# Patient Record
Sex: Female | Born: 1995 | Race: Black or African American | Hispanic: No | Marital: Single | Smoking: Former smoker
Health system: Southern US, Community
[De-identification: ages and names within clinical notes are randomized; demographics above are authoritative.]

## PROBLEM LIST (undated history)

## (undated) HISTORY — PX: NO PAST SURGERIES: SHX2092

---

## 2018-07-05 ENCOUNTER — Telehealth: Payer: Self-pay | Admitting: *Deleted

## 2018-07-05 ENCOUNTER — Telehealth (INDEPENDENT_AMBULATORY_CARE_PROVIDER_SITE_OTHER): Payer: Self-pay | Admitting: Emergency Medicine

## 2018-07-05 ENCOUNTER — Encounter: Payer: Self-pay | Admitting: Emergency Medicine

## 2018-07-05 ENCOUNTER — Other Ambulatory Visit: Payer: Self-pay

## 2018-07-05 DIAGNOSIS — R112 Nausea with vomiting, unspecified: Secondary | ICD-10-CM

## 2018-07-05 NOTE — Telephone Encounter (Signed)
Called spoke to patient about her illness and if she needs an office visit or telemed to get a out of work note. Patient states she was slightly nauseated and vomited at work last night. Patient has sharpe pain in the stomach and she had this problem before and it comes and goes. While the patient was on the phone with me she was having the pain. Patient has no fever. Patient last menstrual period was 06/25/2018. Patient states she does not have any GI problems. I advised patient she will schedule as a telemed visit for today.

## 2018-07-05 NOTE — Telephone Encounter (Signed)
error 

## 2018-07-05 NOTE — Progress Notes (Signed)
Telemedicine Encounter- SOAP NOTE Established Patient  This telephone encounter was conducted with the patient's (or proxy's) verbal consent via audio telecommunications: yes/no: Yes Patient was instructed to have this encounter in a suitably private space; and to only have persons present to whom they give permission to participate. In addition, patient identity was confirmed by use of name plus two identifiers (DOB and address).  I discussed the limitations, risks, security and privacy concerns of performing an evaluation and management service by telephone and the availability of in person appointments. I also discussed with the patient that there may be a patient responsible charge related to this service. The patient expressed understanding and agreed to proceed.  I spent a total of TIME; 0 MIN TO 60 MIN: 15 minutes talking with the patient or their proxy.  No chief complaint on file. Nausea and vomiting  Subjective   Erika Shaw is a 23 y.o. female established patient. Telephone visit today for evaluation of nausea and vomiting.  Also needs a work note to return tonight.  Felt nauseous last night and threw up once.  Also had mild diffuse abdominal cramping without diarrhea.  Denies fever or chills.  No longer vomiting.  Works night shift 11 PM to 7 AM.  No other associated symptoms.  Feels fine now and would like to return to work tonight.  HPI   There are no active problems to display for this patient.   History reviewed. No pertinent past medical history.  No current outpatient medications on file.   No current facility-administered medications for this visit.     Not on File  Social History   Socioeconomic History  . Marital status: Not on file    Spouse name: Not on file  . Number of children: Not on file  . Years of education: Not on file  . Highest education level: Not on file  Occupational History  . Not on file  Social Needs  . Financial resource strain:  Not on file  . Food insecurity:    Worry: Not on file    Inability: Not on file  . Transportation needs:    Medical: Not on file    Non-medical: Not on file  Tobacco Use  . Smoking status: Not on file  Substance and Sexual Activity  . Alcohol use: Not on file  . Drug use: Not on file  . Sexual activity: Not on file  Lifestyle  . Physical activity:    Days per week: Not on file    Minutes per session: Not on file  . Stress: Not on file  Relationships  . Social connections:    Talks on phone: Not on file    Gets together: Not on file    Attends religious service: Not on file    Active member of club or organization: Not on file    Attends meetings of clubs or organizations: Not on file    Relationship status: Not on file  . Intimate partner violence:    Fear of current or ex partner: Not on file    Emotionally abused: Not on file    Physically abused: Not on file    Forced sexual activity: Not on file  Other Topics Concern  . Not on file  Social History Narrative  . Not on file    Review of Systems  Constitutional: Negative for chills and fever.  HENT: Negative for congestion and sore throat.   Respiratory: Negative for cough and shortness  of breath.   Cardiovascular: Negative for chest pain and palpitations.  Gastrointestinal: Positive for abdominal pain, nausea and vomiting. Negative for blood in stool, constipation, diarrhea, heartburn and melena.  Genitourinary: Negative for dysuria, frequency, hematuria and urgency.  Musculoskeletal: Negative for myalgias.  Skin: Negative for rash.  Neurological: Negative for dizziness and headaches.  All other systems reviewed and are negative.   Objective   Vitals as reported by the patient: None available There were no vitals filed for this visit. Awake and oriented x3 in no apparent respiratory distress There are no diagnoses linked to this encounter. Clinically stable.  No medical concerns identified during this visit.  Able to return to work tonight.  Work note provided. Diagnoses and all orders for this visit:  Non-intractable vomiting with nausea, unspecified vomiting type Comments: Resolved     I discussed the assessment and treatment plan with the patient. The patient was provided an opportunity to ask questions and all were answered. The patient agreed with the plan and demonstrated an understanding of the instructions.   The patient was advised to call back or seek an in-person evaluation if the symptoms worsen or if the condition fails to improve as anticipated.  I provided 15 minutes of non-face-to-face time during this encounter.  Georgina QuintMiguel Jose Kemberly Taves, MD  [facility] at [facility]

## 2018-07-17 ENCOUNTER — Other Ambulatory Visit: Payer: Self-pay

## 2018-07-17 ENCOUNTER — Telehealth (INDEPENDENT_AMBULATORY_CARE_PROVIDER_SITE_OTHER): Payer: Self-pay | Admitting: Emergency Medicine

## 2018-07-17 ENCOUNTER — Encounter: Payer: Self-pay | Admitting: Emergency Medicine

## 2018-07-17 DIAGNOSIS — Z7689 Persons encountering health services in other specified circumstances: Secondary | ICD-10-CM

## 2018-07-17 NOTE — Progress Notes (Signed)
Called patient to triage for appointment. Patient state she needs a note to return to work after having a high temperature Friday night when tested.  Patient was sent home until she get the work note. Patient states she was not told the temperature reading.

## 2018-07-17 NOTE — Progress Notes (Signed)
Telemedicine Encounter- SOAP NOTE Established Patient  This telephone encounter was conducted with the patient's (or proxy's) verbal consent via audio telecommunications: yes/no: Yes Patient was instructed to have this encounter in a suitably private space; and to only have persons present to whom they give permission to participate. In addition, patient identity was confirmed by use of name plus two identifiers (DOB and address).  I discussed the limitations, risks, security and privacy concerns of performing an evaluation and management service by telephone and the availability of in person appointments. I also discussed with the patient that there may be a patient responsible charge related to this service. The patient expressed understanding and agreed to proceed.  I spent a total of TIME; 0 MIN TO 60 MIN: 10 minutes talking with the patient or their proxy.  No chief complaint on file. Needs a work note  Subjective   Erika Shaw is a 23 y.o. female established patient. Telephone visit today for work clearance.  Was sent home last Friday because of high temperature.  Needs work note to return tonight.  Asymptomatic.  Denies flulike symptoms.  Does not feel sick at all.  HPI   There are no active problems to display for this patient.   No past medical history on file.  No current outpatient medications on file.   No current facility-administered medications for this visit.     Not on File  Social History   Socioeconomic History  . Marital status: Not on file    Spouse name: Not on file  . Number of children: Not on file  . Years of education: Not on file  . Highest education level: Not on file  Occupational History  . Not on file  Social Needs  . Financial resource strain: Not on file  . Food insecurity:    Worry: Not on file    Inability: Not on file  . Transportation needs:    Medical: Not on file    Non-medical: Not on file  Tobacco Use  . Smoking status:  Not on file  Substance and Sexual Activity  . Alcohol use: Not on file  . Drug use: Not on file  . Sexual activity: Not on file  Lifestyle  . Physical activity:    Days per week: Not on file    Minutes per session: Not on file  . Stress: Not on file  Relationships  . Social connections:    Talks on phone: Not on file    Gets together: Not on file    Attends religious service: Not on file    Active member of club or organization: Not on file    Attends meetings of clubs or organizations: Not on file    Relationship status: Not on file  . Intimate partner violence:    Fear of current or ex partner: Not on file    Emotionally abused: Not on file    Physically abused: Not on file    Forced sexual activity: Not on file  Other Topics Concern  . Not on file  Social History Narrative  . Not on file    Review of Systems  Constitutional: Negative.  Negative for chills and fever.  HENT: Negative.  Negative for congestion and sore throat.   Eyes: Negative for redness.  Respiratory: Negative.  Negative for cough and shortness of breath.   Cardiovascular: Negative.  Negative for chest pain and palpitations.  Gastrointestinal: Negative.  Negative for abdominal pain, diarrhea, nausea and  vomiting.  Genitourinary: Negative.  Negative for dysuria and urgency.  Musculoskeletal: Negative for myalgias.  Skin: Negative.  Negative for rash.  Neurological: Negative for dizziness and headaches.    Objective   Vitals as reported by the patient: None available Awake and oriented x3 in no apparent respiratory distress.  No coughing. There were no vitals filed for this visit.  There are no diagnoses linked to this encounter.  Diagnoses and all orders for this visit:  Return to work evaluation    Clinically stable with no signs of illness.  Cleared to go back to work tonight. Work note prepared and printed for the patient to pick up.  I discussed the assessment and treatment plan with the  patient. The patient was provided an opportunity to ask questions and all were answered. The patient agreed with the plan and demonstrated an understanding of the instructions.   The patient was advised to call back or seek an in-person evaluation if the symptoms worsen or if the condition fails to improve as anticipated.  I provided 10 minutes of non-face-to-face time during this encounter.  Georgina QuintMiguel Jose Chrishawn Kring, MD  [facility]

## 2020-03-17 ENCOUNTER — Emergency Department (HOSPITAL_COMMUNITY): Payer: Self-pay

## 2020-03-17 ENCOUNTER — Emergency Department (HOSPITAL_COMMUNITY)
Admission: EM | Admit: 2020-03-17 | Discharge: 2020-03-18 | Disposition: A | Discharge status: Home / Self Care | Payer: Self-pay | Attending: Emergency Medicine | Admitting: Emergency Medicine

## 2020-03-17 ENCOUNTER — Encounter (HOSPITAL_COMMUNITY): Payer: Self-pay | Admitting: Emergency Medicine

## 2020-03-17 ENCOUNTER — Other Ambulatory Visit: Payer: Self-pay

## 2020-03-17 DIAGNOSIS — U071 COVID-19: Secondary | ICD-10-CM | POA: Insufficient documentation

## 2020-03-17 LAB — CBC
HCT: 43.1 % (ref 36.0–46.0)
Hemoglobin: 14.1 g/dL (ref 12.0–15.0)
MCH: 30.6 pg (ref 26.0–34.0)
MCHC: 32.7 g/dL (ref 30.0–36.0)
MCV: 93.5 fL (ref 80.0–100.0)
Platelets: 207 10*3/uL (ref 150–400)
RBC: 4.61 MIL/uL (ref 3.87–5.11)
RDW: 11.9 % (ref 11.5–15.5)
WBC: 4 10*3/uL (ref 4.0–10.5)
nRBC: 0 % (ref 0.0–0.2)

## 2020-03-17 LAB — BASIC METABOLIC PANEL
Anion gap: 8 (ref 5–15)
BUN: 12 mg/dL (ref 6–20)
CO2: 23 mmol/L (ref 22–32)
Calcium: 8.8 mg/dL — ABNORMAL LOW (ref 8.9–10.3)
Chloride: 107 mmol/L (ref 98–111)
Creatinine, Ser: 0.75 mg/dL (ref 0.44–1.00)
GFR, Estimated: >60 mL/min (ref >60)
Glucose, Bld: 83 mg/dL (ref 70–99)
Potassium: 3.6 mmol/L (ref 3.5–5.1)
Sodium: 138 mmol/L (ref 135–145)

## 2020-03-17 LAB — RESP PANEL BY RT-PCR (FLU A&B, COVID) ARPGX2
Influenza A by PCR: NEGATIVE
Influenza B by PCR: NEGATIVE
SARS Coronavirus 2 by RT PCR: POSITIVE — AB

## 2020-03-17 LAB — I-STAT BETA HCG BLOOD, ED (MC, WL, AP ONLY): I-stat hCG, quantitative: <5 m[IU]/mL (ref <5)

## 2020-03-17 LAB — TROPONIN I (HIGH SENSITIVITY): Troponin I (High Sensitivity): 3 ng/L (ref <18)

## 2020-03-17 MED ORDER — ACETAMINOPHEN 325 MG PO TABS
650.0000 mg | ORAL_TABLET | Freq: Once | ORAL | Status: AC | PRN
  Administered 2020-03-17: 650 mg via ORAL
  Filled 2020-03-17: qty 2

## 2020-03-17 NOTE — ED Triage Notes (Signed)
Pt c/o fever, headache, chest pain and sore throat x 2 days.

## 2020-03-18 LAB — TROPONIN I (HIGH SENSITIVITY): Troponin I (High Sensitivity): <2 ng/L (ref <18)

## 2020-03-18 MED ORDER — BENZONATATE 100 MG PO CAPS: 100.0000 mg | ORAL_CAPSULE | Freq: Three times a day (TID) | ORAL | 0 refills | Status: DC

## 2020-03-18 MED ORDER — NAPROXEN 500 MG PO TABS: 500.0000 mg | ORAL_TABLET | Freq: Two times a day (BID) | ORAL | 0 refills | Status: DC | PRN

## 2020-03-18 NOTE — ED Provider Notes (Signed)
[facility], [facility] EMERGENCY DEPARTMENT Provider Note   CSN: [id number] Arrival date & time: 03/17/20  2046     History Chief Complaint  Patient presents with  . Fever    Erika Shaw is a 25 y.o. female without significant past medical hx who presents to the ED with complaints of fever x 5 days. Patient reports fevers, chills, headaches, congestion, sore throat, cough, & chest pain w/ coughing. No other alleviating/aggravating factors. Feels mildly dyspneic at times. Denies syncope, leg pain/swelling, hemoptysis, recent surgery/trauma, recent long travel, personal hx of cancer, or hx of DVT/PE.    HPI     History reviewed. No pertinent past medical history.  There are no problems to display for this patient.   History reviewed. No pertinent surgical history.   OB History   No obstetric history on file.     No family history on file.     Home Medications Prior to Admission medications   Not on File    Allergies    Patient has no known allergies.  Review of Systems   Review of Systems  Constitutional: Positive for chills, fatigue and fever.  HENT: Positive for congestion.   Respiratory: Positive for cough and shortness of breath.   Cardiovascular: Positive for chest pain. Negative for leg swelling.  Gastrointestinal: Negative for abdominal pain and vomiting.  Neurological: Negative for syncope.  All other systems reviewed and are negative.   Physical Exam Updated Vital Signs BP 110/73 (BP Location: Left Arm)   Pulse 76   Temp 99 F (37.2 C) (Oral)   Resp 16   Ht 5\' 4"  (1.626 m)   Wt 79.4 kg   SpO2 96%   BMI 30.04 kg/m   Physical Exam Vitals and nursing note reviewed.  Constitutional:      General: She is not in acute distress.    Appearance: She is well-developed.  HENT:     Head: Normocephalic and atraumatic.     Right Ear: Ear canal normal. Tympanic membrane is not perforated, erythematous, retracted or bulging.     Left Ear:  Ear canal normal. Tympanic membrane is not perforated, erythematous, retracted or bulging.     Ears:     Comments: No mastoid erythema/swelling/tenderness.     Nose:     Right Sinus: No maxillary sinus tenderness or frontal sinus tenderness.     Left Sinus: No maxillary sinus tenderness or frontal sinus tenderness.     Mouth/Throat:     Pharynx: Uvula midline. No oropharyngeal exudate or posterior oropharyngeal erythema.     Comments: What appear to be cold sores noted to left upper lip.  Posterior oropharynx is symmetric appearing. Patient tolerating own secretions without difficulty. No trismus. No drooling. No hot potato voice. No swelling beneath the tongue, submandibular compartment is soft.  Eyes:     General:        Right eye: No discharge.        Left eye: No discharge.     Conjunctiva/sclera: Conjunctivae normal.     Pupils: Pupils are equal, round, and reactive to light.  Cardiovascular:     Rate and Rhythm: Normal rate and regular rhythm.     Heart sounds: No murmur heard.   Pulmonary:     Effort: Pulmonary effort is normal. No respiratory distress.     Breath sounds: Normal breath sounds. No wheezing, rhonchi or rales.  Chest:     Chest wall: Tenderness (anterior chest wall) present.  Abdominal:  General: There is no distension.     Palpations: Abdomen is soft.     Tenderness: There is no abdominal tenderness. There is no guarding or rebound.  Musculoskeletal:     Cervical back: Normal range of motion and neck supple. No edema or rigidity.     Right lower leg: No edema.     Left lower leg: No edema.  Lymphadenopathy:     Cervical: No cervical adenopathy.  Skin:    General: Skin is warm and dry.     Findings: No rash.  Neurological:     Mental Status: She is alert.  Psychiatric:        Behavior: Behavior normal.     ED Results / Procedures / Treatments   Labs (all labs ordered are listed, but only abnormal results are displayed) Labs Reviewed  RESP  PANEL BY RT-PCR (FLU A&B, COVID) ARPGX2 - Abnormal; Notable for the following components:      Result Value   SARS Coronavirus 2 by RT PCR POSITIVE (*)    All other components within normal limits  BASIC METABOLIC PANEL - Abnormal; Notable for the following components:   Calcium 8.8 (*)    All other components within normal limits  CBC  I-STAT BETA HCG BLOOD, ED ([facility], [facility], [facility] ONLY)  TROPONIN I (HIGH SENSITIVITY)  TROPONIN I (HIGH SENSITIVITY)    EKG None EKG Interpretation  Date/Time: 03/17/20 @ 2353   Ventricular Rate: 81 bpm   PR Interval: 146 ms   QRS Duration: 62 ms   QT Interval: 350 ms   QTC Calculation: 406 ms      Text Interpretation: Sinus rhythm, nonspecific ST changes    Radiology DG Chest Portable 1 View  Result Date: 03/17/2020 CLINICAL DATA:  Fever headache chest pain EXAM: PORTABLE CHEST 1 VIEW COMPARISON:  None. FINDINGS: The heart size and mediastinal contours are within normal limits. Both lungs are clear. The visualized skeletal structures are unremarkable. IMPRESSION: No active disease. Electronically Signed   By: Marlan Palau M.D.   On: 03/17/2020 21:34    Procedures Procedures (including critical care time)  Medications Ordered in ED Medications  acetaminophen (TYLENOL) tablet 650 mg (650 mg Oral Given 03/17/20 2105)    ED Course  I have reviewed the triage vital signs and the nursing notes.  Pertinent labs & imaging results that were available during my care of the patient were reviewed by me and considered in my medical decision making (see chart for details).  Erika Shaw was evaluated in Emergency Department on 03/18/2020 for the symptoms described in the history of present illness. He/she was evaluated in the context of the global COVID-19 pandemic, which necessitated consideration that the patient might be at risk for infection with the SARS-CoV-2 virus that causes COVID-19. Institutional protocols and algorithms that pertain to the evaluation  of patients at risk for COVID-19 are in a state of rapid change based on information released by regulatory bodies including the CDC and federal and state organizations. These policies and algorithms were followed during the patient's care in the ED.    MDM Rules/Calculators/A&P                          Patient presents to the ED with fever, cough, chest pain w/ coughing and additional sxs. Patient is nontoxic, initially febrile- resolved w/ repeat vitals.   Additional history obtained:  Additional history obtained from chart review & nursing note review.  Lab Tests:  I reviewed and interpreted labs, which included:  CBC, BMP, preg test, troponins: Unremarkable COVID: Positive  Imaging Studies ordered:  CXR ordered by triage, I independently visualized and interpreted imaging which showed No active disease  ED Course:  Exam is without signs of AOM, AOE, or mastoiditis.Centor score low risk for strep, exam not consistent w/ RPA/PTA. No sinus tenderness. No meningeal signs. Lungs are CTA without focal adventitious sounds, no signs of increased work of breathing, CXR without infiltrate, doubt CAP. Low risk wells- doubt PE. Delta troponin negative, chest pain reproducible w/ chest wall palpation- doubt ACS.  Abdomen nontender w/o peritoneal signs. COVID 19 positive. Ambulatory SPO2 98-100% on RA. Appears appropriate for discharge w/ supportive care. I discussed results, treatment plan, need for follow-up, and return precautions with the patient. Provided opportunity for questions, patient confirmed understanding and is in agreement with plan.    Portions of this note were generated with Scientist, clinical (histocompatibility and immunogenetics). Dictation errors may occur despite best attempts at proofreading.  Final Clinical Impression(s) / ED Diagnoses Final diagnoses:  COVID-19    Rx / DC Orders ED Discharge Orders         Ordered    naproxen (NAPROSYN) 500 MG tablet  2 times daily PRN        03/18/20 0125     benzonatate (TESSALON) 100 MG capsule  Every 8 hours        03/18/20 0125           Cherly Anderson, Erika-C 03/18/20 0141    Gilda Crease, MD 03/18/20 [id number]

## 2020-03-18 NOTE — Discharge Instructions (Signed)
Your covid 19 test - we suspect this is the cause of your symptoms.  We are sending you home with the following medicines:  - Naproxen is a nonsteroidal anti-inflammatory medication that will help with pain and swelling. Be sure to take this medication as prescribed with food, 1 pill every 12 hours,  It should be taken with food, as it can cause stomach upset, and more seriously, stomach bleeding. Do not take other nonsteroidal anti-inflammatory medications with this such as Advil, Motrin, Aleve, Mobic, Goodie Powder, or Motrin.    - Tessalon- Take every 8 hours as needed for coughing.   You make take Tylenol per over the counter dosing with these medications.   We have prescribed you new medication(s) today. Discuss the medications prescribed today with your pharmacist as they can have adverse effects and interactions with your other medicines including over the counter and prescribed medications. Seek medical evaluation if you start to experience new or abnormal symptoms after taking one of these medicines, seek care immediately if you start to experience difficulty breathing, feeling of your throat closing, facial swelling, or rash as these could be indications of a more serious allergic reaction    We are instructing patient's with COVID 19 or symptoms of COVID 19 to follow the below instructions regardless of vaccination status:  - Stay home for 5 days. - If you have no symptoms or your symptoms are resolving after 5 days, you can leave your house--> Continue to wear a mask around others for 5 additional days. - If you have a fever, continue to stay home until your fever resolves.days.   Please follow up with primary care within 3-5 days for re-evaluation- call prior to going to the office to make them aware of your symptoms as some offices are altering their method of seeing patients with COVID 19 symptoms, we have also provided our [place] covid clinic for follow up as well.  Return to the ER  for new or worsening symptoms including but not limited to increased work of breathing, chest pain, passing out, or any other concerns.       Person Under Monitoring Name: Erika Shaw  Location: [address] [place] [address]   Infection Prevention Recommendations for Individuals Confirmed to have, or Being Evaluated for, 2019 Novel Coronavirus (COVID-19) Infection Who Receive Care at Home  Individuals who are confirmed to have, or are being evaluated for, COVID-19 should follow the prevention steps below until a healthcare provider or local or state health department says they can return to normal activities.  Stay home except to get medical care You should restrict activities outside your home, except for getting medical care. Do not go to work, school, or public areas, and do not use public transportation or taxis.  Call ahead before visiting your doctor Before your medical appointment, call the healthcare provider and tell them that you have, or are being evaluated for, COVID-19 infection. This will help the healthcare provider's office take steps to keep other people from getting infected. Ask your healthcare provider to call the local or state health department.  Monitor your symptoms Seek prompt medical attention if your illness is worsening (e.g., difficulty breathing). Before going to your medical appointment, call the healthcare provider and tell them that you have, or are being evaluated for, COVID-19 infection. Ask your healthcare provider to call the local or state health department.  Wear a facemask You should wear a facemask that covers your nose and mouth  when you are in the same room with other people and when you visit a healthcare provider. People who live with or visit you should also wear a facemask while they are in the same room with you.  Separate yourself from other people in your home As much as possible, you should stay in a  different room from other people in your home. Also, you should use a separate bathroom, if available.  Avoid sharing household items You should not share dishes, drinking glasses, cups, eating utensils, towels, bedding, or other items with other people in your home. After using these items, you should wash them thoroughly with soap and water.  Cover your coughs and sneezes Cover your mouth and nose with a tissue when you cough or sneeze, or you can cough or sneeze into your sleeve. Throw used tissues in a lined trash can, and immediately wash your hands with soap and water for at least 20 seconds or use an alcohol-based hand rub.  Wash your [employer] your hands often and thoroughly with soap and water for at least 20 seconds. You can use an alcohol-based hand sanitizer if soap and water are not available and if your hands are not visibly dirty. Avoid touching your eyes, nose, and mouth with unwashed hands.   Prevention Steps for Caregivers and Household Members of Individuals Confirmed to have, or Being Evaluated for, COVID-19 Infection Being Cared for in the Home  If you live with, or provide care at home for, a person confirmed to have, or being evaluated for, COVID-19 infection please follow these guidelines to prevent infection:  Follow healthcare provider's instructions Make sure that you understand and can help the patient follow any healthcare provider instructions for all care.  Provide for the patient's basic needs You should help the patient with basic needs in the home and provide support for getting groceries, prescriptions, and other personal needs.  Monitor the patient's symptoms If they are getting sicker, call his or her medical provider and tell them that the patient has, or is being evaluated for, COVID-19 infection. This will help the healthcare provider's office take steps to keep other people from getting infected. Ask the healthcare provider to call the local  or state health department.  Limit the number of people who have contact with the patient If possible, have only one caregiver for the patient. Other household members should stay in another home or place of residence. If this is not possible, they should stay in another room, or be separated from the patient as much as possible. Use a separate bathroom, if available. Restrict visitors who do not have an essential need to be in the home.  Keep older adults, very young children, and other sick people away from the patient Keep older adults, very young children, and those who have compromised immune systems or chronic health conditions away from the patient. This includes people with chronic heart, lung, or kidney conditions, diabetes, and cancer.  Ensure good ventilation Make sure that shared spaces in the home have good air flow, such as from an air conditioner or an opened window, weather permitting.  Wash your hands often Wash your hands often and thoroughly with soap and water for at least 20 seconds. You can use an alcohol based hand sanitizer if soap and water are not available and if your hands are not visibly dirty. Avoid touching your eyes, nose, and mouth with unwashed hands. Use disposable paper towels to dry your hands. If not  available, use dedicated cloth towels and replace them when they become wet.  Wear a facemask and gloves Wear a disposable facemask at all times in the room and gloves when you touch or have contact with the patient's blood, body fluids, and/or secretions or excretions, such as sweat, saliva, sputum, nasal mucus, vomit, urine, or feces.  Ensure the mask fits over your nose and mouth tightly, and do not touch it during use. Throw out disposable facemasks and gloves after using them. Do not reuse. Wash your hands immediately after removing your facemask and gloves. If your personal clothing becomes contaminated, carefully remove clothing and launder. Wash your  hands after handling contaminated clothing. Place all used disposable facemasks, gloves, and other waste in a lined container before disposing them with other household waste. Remove gloves and wash your hands immediately after handling these items.  Do not share dishes, glasses, or other household items with the patient Avoid sharing household items. You should not share dishes, drinking glasses, cups, eating utensils, towels, bedding, or other items with a patient who is confirmed to have, or being evaluated for, COVID-19 infection. After the person uses these items, you should wash them thoroughly with soap and water.  Wash laundry thoroughly Immediately remove and wash clothes or bedding that have blood, body fluids, and/or secretions or excretions, such as sweat, saliva, sputum, nasal mucus, vomit, urine, or feces, on them. Wear gloves when handling laundry from the patient. Read and follow directions on labels of laundry or clothing items and detergent. In general, wash and dry with the warmest temperatures recommended on the label.  Clean all areas the individual has used often Clean all touchable surfaces, such as counters, tabletops, doorknobs, bathroom fixtures, toilets, phones, keyboards, tablets, and bedside tables, every day. Also, clean any surfaces that may have blood, body fluids, and/or secretions or excretions on them. Wear gloves when cleaning surfaces the patient has come in contact with. Use a diluted bleach solution (e.g., dilute bleach with 1 part bleach and 10 parts water) or a household disinfectant with a label that says EPA-registered for coronaviruses. To make a bleach solution at home, add 1 tablespoon of bleach to 1 quart (4 cups) of water. For a larger supply, add  cup of bleach to 1 gallon (16 cups) of water. Read labels of cleaning products and follow recommendations provided on product labels. Labels contain instructions for safe and effective use of the cleaning  product including precautions you should take when applying the product, such as wearing gloves or eye protection and making sure you have good ventilation during use of the product. Remove gloves and wash hands immediately after cleaning.  Monitor yourself for signs and symptoms of illness Caregivers and household members are considered close contacts, should monitor their health, and will be asked to limit movement outside of the home to the extent possible. Follow the monitoring steps for close contacts listed on the symptom monitoring form.   ? If you have additional questions, contact your local health department or call the epidemiologist on call at [phone] (available 24/7). ? This guidance is subject to change. For the most up-to-date guidance from [facility], please refer to their website: [email or web address]

## 2021-01-30 ENCOUNTER — Ambulatory Visit (INDEPENDENT_AMBULATORY_CARE_PROVIDER_SITE_OTHER): Payer: Self-pay

## 2021-01-30 ENCOUNTER — Encounter (HOSPITAL_COMMUNITY): Payer: Self-pay

## 2021-01-30 ENCOUNTER — Ambulatory Visit (HOSPITAL_COMMUNITY)
Admission: EM | Admit: 2021-01-30 | Discharge: 2021-01-30 | Disposition: A | Discharge status: Home / Self Care | Payer: Self-pay | Attending: Emergency Medicine | Admitting: Emergency Medicine

## 2021-01-30 ENCOUNTER — Other Ambulatory Visit: Payer: Self-pay

## 2021-01-30 DIAGNOSIS — M62838 Other muscle spasm: Secondary | ICD-10-CM

## 2021-01-30 DIAGNOSIS — M542 Cervicalgia: Secondary | ICD-10-CM

## 2021-01-30 MED ORDER — KETOROLAC TROMETHAMINE 60 MG/2ML IM SOLN
60.0000 mg | Freq: Once | INTRAMUSCULAR | Status: AC
  Administered 2021-01-30: 60 mg via INTRAMUSCULAR

## 2021-01-30 MED ORDER — IBUPROFEN 800 MG PO TABS: 800.0000 mg | ORAL_TABLET | Freq: Three times a day (TID) | ORAL | 0 refills | Status: AC

## 2021-01-30 MED ORDER — BACLOFEN 10 MG PO TABS: 10.0000 mg | ORAL_TABLET | Freq: Three times a day (TID) | ORAL | 0 refills | Status: DC

## 2021-01-30 MED ORDER — IBUPROFEN 800 MG PO TABS: 800.0000 mg | ORAL_TABLET | Freq: Three times a day (TID) | ORAL | 0 refills | Status: DC

## 2021-01-30 MED ORDER — KETOROLAC TROMETHAMINE 60 MG/2ML IM SOLN
INTRAMUSCULAR | Status: AC
  Filled 2021-01-30: qty 2

## 2021-01-30 NOTE — ED Provider Notes (Signed)
Per my    [facility]    CSN: [id number] Arrival date & time: 01/30/21  1414    HISTORY   Chief Complaint  Patient presents with   Generalized Body Aches    X 1 MONTH   HPI Erika Shaw is a 25 y.o. female. Patient presents today with a 1 month history of upper back pain that radiates to her right upper back.  Patient states that it came on gradually and has been consistently getting worse.  Per my observation, patient is cradling her right arm and very protective of any contact or movement in that area.  Patient is also unwilling to rotate her head side to side as we are talking.  Patient was provided with an injection of ketorolac prior to physical exam today secondary to pain and probable anxiety about pain.  The history is provided by the patient.  History reviewed. No pertinent past medical history. There are no problems to display for this patient.  History reviewed. No pertinent surgical history. OB History   No obstetric history on file.    Home Medications    Prior to Admission medications   Medication Sig Start Date End Date Taking? Authorizing Provider  baclofen (LIORESAL) 10 MG tablet Take 1 tablet (10 mg total) by mouth 3 (three) times daily. 01/30/21  Yes Theadora Rama Scales, PA-C  benzonatate (TESSALON) 100 MG capsule Take 1 capsule (100 mg total) by mouth every 8 (eight) hours. 03/18/20   Petrucelli, Samantha R, PA-C  ibuprofen (ADVIL) 800 MG tablet Take 1 tablet (800 mg total) by mouth 3 (three) times daily for 10 days. 01/30/21 02/09/21  Theadora Rama Scales, PA-C  naproxen (NAPROSYN) 500 MG tablet Take 1 tablet (500 mg total) by mouth 2 (two) times daily as needed for moderate pain. 03/18/20   Petrucelli, Pleas Koch, PA-C   Family History No family history on file. Social History   Allergies   Patient has no known allergies.  Review of Systems Review of Systems Pertinent findings noted in history of present illness.   Physical  Exam Triage Vital Signs ED Triage Vitals  Enc Vitals Group     BP 01/01/21 0827 (!) 147/82     Pulse Rate 01/01/21 0827 72     Resp 01/01/21 0827 18     Temp 01/01/21 0827 98.3 F (36.8 C)     Temp Source 01/01/21 0827 Oral     SpO2 01/01/21 0827 98 %     Weight --      Height --      Head Circumference --      Peak Flow --      Pain Score 01/01/21 0826 5     Pain Loc --      Pain Edu? --      Excl. in GC? --   No data found.  Updated Vital Signs BP 102/66 (BP Location: Left Arm)   Pulse 70   Temp 98.2 F (36.8 C)   Resp 16   LMP 01/28/2021   SpO2 100%   Physical Exam Vitals and nursing note reviewed.  Constitutional:      General: She is not in acute distress.    Appearance: Normal appearance. She is not ill-appearing.  HENT:     Head: Normocephalic and atraumatic.  Eyes:     General: Lids are normal.        Right eye: No discharge.        Left eye: No discharge.  Extraocular Movements: Extraocular movements intact.     Conjunctiva/sclera: Conjunctivae normal.     Right eye: Right conjunctiva is not injected.     Left eye: Left conjunctiva is not injected.  Neck:     Trachea: Trachea and phonation normal.  Cardiovascular:     Rate and Rhythm: Normal rate and regular rhythm.     Pulses: Normal pulses.     Heart sounds: Normal heart sounds. No murmur heard.   No friction rub. No gallop.  Pulmonary:     Effort: Pulmonary effort is normal. No accessory muscle usage, prolonged expiration or respiratory distress.     Breath sounds: Normal breath sounds. No stridor, decreased air movement or transmitted upper airway sounds. No decreased breath sounds, wheezing, rhonchi or rales.  Chest:     Chest wall: No tenderness.  Musculoskeletal:        General: Tenderness (Right mid trapezius, cervical paraspinous muscles) present. Normal range of motion.     Cervical back: Normal range of motion and neck supple. Normal range of motion.  Lymphadenopathy:     Cervical:  No cervical adenopathy.  Skin:    General: Skin is warm and dry.     Findings: No erythema or rash.  Neurological:     General: No focal deficit present.     Mental Status: She is alert and oriented to person, place, and time.  Psychiatric:        Mood and Affect: Mood normal.        Behavior: Behavior normal.    Visual Acuity Right Eye Distance:   Left Eye Distance:   Bilateral Distance:    Right Eye Near:   Left Eye Near:    Bilateral Near:     UC Couse / Diagnostics / Procedures:    EKG  Radiology DG Cervical Spine Complete  Result Date: 01/30/2021 CLINICAL DATA:  Neck pain and upper body pain for 1 month. EXAM: CERVICAL SPINE - COMPLETE 4+ VIEW COMPARISON:  None. FINDINGS: Neck positioned in kyphosis, apex at C5. No fracture or bone lesion. No spondylolisthesis. Disc spaces are well maintained.  Neural foramina are widely patent. Soft tissues are unremarkable. IMPRESSION: 1. Reversal the normal cervical lordosis. No fracture or acute finding. No other abnormality. Electronically Signed   By: Amie Portland M.D.   On: 01/30/2021 17:24    Procedures Procedures (including critical care time)  UC Diagnoses / Final Clinical Impressions(s)    Final diagnoses:  Cervical paraspinous muscle spasm   I have reviewed the triage vital signs and the nursing notes.  Pertinent labs & imaging results that were available during my care of the patient were reviewed by me and considered in my medical decision making (see chart for details).    X-ray of cervical spine appears normal rate.  Patient advised that her pain is most likely secondary to acute muscle spasm and that anti-inflammatories along with muscle relaxers will be the treatment of choice.  Patient/parent/caregiver verbalized understanding and agreement of plan as discussed.  All questions were addressed during visit.  Please see discharge instructions below for further details of plan.  ED Prescriptions     Medication Sig  Dispense Auth. Provider   ibuprofen (ADVIL) 800 MG tablet  (Status: Discontinued) Take 1 tablet (800 mg total) by mouth 3 (three) times daily for 10 days. 21 tablet Theadora Rama Scales, PA-C   baclofen (LIORESAL) 10 MG tablet Take 1 tablet (10 mg total) by mouth 3 (three) times daily. 30 each  Theadora Rama Scales, PA-C   ibuprofen (ADVIL) 800 MG tablet Take 1 tablet (800 mg total) by mouth 3 (three) times daily for 10 days. 30 tablet Theadora Rama Scales, PA-C      PDMP not reviewed this encounter.  Pending results:  Labs Reviewed - No data to display   Medications Ordered in UC: Medications  ketorolac (TORADOL) injection 60 mg (60 mg Intramuscular Given 01/30/21 1642)    Discharge Instructions:   Discharge Instructions      The x-ray of your spine is very reassuring, there is no sign of dislocation, trauma or acute injury.  Is my opinion that the cause of your pain is muscle spasm of the muscles that surround your cervical spine and your upper trapezius muscle.  I recommend that you continue anti-inflammatory medication, I have prescribed ibuprofen 100 mg for you to take 3 times daily.  I have also provided you with a muscle relaxer, baclofen.  You can take this medication 3 times daily as needed, keep in mind that it does make you very sleepy so you may prefer just to take it at bedtime.  If you do not have significant improvement of your pain in the next 3 days, please return to urgent care for repeat ketorolac injection, the injection that you received in the clinic today.      Disposition Upon Discharge:  Patient presented with an acute illness with associated systemic symptoms and significant discomfort requiring urgent management. In my opinion, this is a condition that a prudent lay person (someone who possesses an average knowledge of health and medicine) may potentially expect to result in complications if not addressed urgently such as respiratory distress,  impairment of bodily function or dysfunction of bodily organs.   Routine symptom specific, illness specific and/or disease specific instructions were discussed with the patient and/or caregiver at length.   As such, the patient has been evaluated and assessed, work-up was performed and treatment was provided in alignment with urgent care protocols and evidence based medicine.  Patient/parent/caregiver has been advised that the patient may require follow up for further testing and treatment if the symptoms continue in spite of treatment, as clinically indicated and appropriate.  If the patient was tested for COVID-19, Influenza and/or RSV, then the patient/parent/guardian was advised to isolate at home pending the results of his/her diagnostic coronavirus test and potentially longer if they're positive. I have also advised pt that if his/her COVID-19 test returns positive, it's recommended to self-isolate for at least 10 days after symptoms first appeared AND until fever-free for 24 hours without fever reducer AND other symptoms have improved or resolved. Discussed self-isolation recommendations as well as instructions for household member/close contacts as per the [facility] and [place] [facility], and also gave patient the COVID packet with this information.  Patient/parent/caregiver has been advised to [facility] or [facility]CP in 3-5 days if no better; to PCP or the Emergency Department if new signs and symptoms develop, or if the current signs or symptoms continue to change or worsen for further workup, evaluation and treatment as clinically indicated and appropriate  The patient will follow up with their current PCP if and as advised. If the patient does not currently have a PCP we will assist them in obtaining one.   The patient may need specialty follow up if the symptoms continue, in spite of conservative treatment and management, for further workup, evaluation, consultation and treatment as clinically indicated  and appropriate.  Condition: stable for  discharge home Home: take medications as prescribed; routine discharge instructions as discussed; follow up as advised.    Theadora Rama Scales, PA-C 01/30/21 1728

## 2021-01-30 NOTE — ED Triage Notes (Signed)
Pt presents with upper body pain x 1 month.

## 2021-01-30 NOTE — Discharge Instructions (Addendum)
The x-ray of your spine is very reassuring, there is no sign of dislocation, trauma or acute injury.  It is my opinion that the cause of your pain is muscle spasm of the muscles that surround your cervical spine and your upper trapezius muscle.  I recommend that you continue anti-inflammatory medication, I have prescribed ibuprofen 100 mg for you to take 3 times daily.  I have also provided you with a muscle relaxer, baclofen.  You can take this medication 3 times daily as needed, keep in mind that it does make you very sleepy so you may prefer just to take it at bedtime.  If you do not have significant improvement of your pain in the next 3 days, please return to urgent care for repeat ketorolac injection, the injection that you received in the clinic today.

## 2021-01-30 NOTE — ED Triage Notes (Signed)
Patient complain of upper body pain x 1 month. No history of falls.

## 2021-08-19 ENCOUNTER — Ambulatory Visit (HOSPITAL_COMMUNITY)
Admission: EM | Admit: 2021-08-19 | Discharge: 2021-08-19 | Disposition: A | Discharge status: Home / Self Care | Payer: Self-pay | Attending: Nurse Practitioner | Admitting: Nurse Practitioner

## 2021-08-19 ENCOUNTER — Encounter (HOSPITAL_COMMUNITY): Payer: Self-pay

## 2021-08-19 DIAGNOSIS — J029 Acute pharyngitis, unspecified: Secondary | ICD-10-CM | POA: Insufficient documentation

## 2021-08-19 DIAGNOSIS — J069 Acute upper respiratory infection, unspecified: Secondary | ICD-10-CM | POA: Insufficient documentation

## 2021-08-19 DIAGNOSIS — R0989 Other specified symptoms and signs involving the circulatory and respiratory systems: Secondary | ICD-10-CM | POA: Insufficient documentation

## 2021-08-19 LAB — POCT RAPID STREP A, ED / UC: Streptococcus, Group A Screen (Direct): NEGATIVE

## 2021-08-19 MED ORDER — AMOXICILLIN-POT CLAVULANATE 875-125 MG PO TABS: 1.0000 | ORAL_TABLET | Freq: Two times a day (BID) | ORAL | 0 refills | Status: DC

## 2021-08-19 NOTE — ED Triage Notes (Signed)
Pt c/o productive cough with clear mucus, chills, dizziness, chest congestion, and sore throat since yesterday. States took dayquil and benadryl yesterday.

## 2021-08-19 NOTE — ED Provider Notes (Signed)
[facility]    CSN: [id number] Arrival date & time: 08/19/21  0913      History   Chief Complaint Chief Complaint  Patient presents with   Cough    HPI Erika Shaw is a 26 y.o. female.   HPI  She is in today with a one day history of  dizziness, cough, chest congestion, sore throat. She reports "throat feels horrible" She denies being around anyone else that has been sick. She has had some chest pain with cough.  Denies fever, visual changes, shortness of breath, dyspnea on exertion, nausea, or vomiting. Dayquil and Benadryl not effective.   History reviewed. No pertinent past medical history.  There are no problems to display for this patient.   History reviewed. No pertinent surgical history.  OB History   No obstetric history on file.      Home Medications    Prior to Admission medications   Medication Sig Start Date End Date Taking? Authorizing Provider  amoxicillin-clavulanate (AUGMENTIN) 875-125 MG tablet Take 1 tablet by mouth every 12 (twelve) hours. 08/19/21  Yes Barbette Merino, NP    Family History History reviewed. No pertinent family history.  Social History Social History   Tobacco Use   Smoking status: Some Days    Types: Cigars   Smokeless tobacco: Never  Substance Use Topics   Alcohol use: Not Currently   Drug use: Not Currently     Allergies   Patient has no known allergies.   Review of Systems Review of Systems   Physical Exam Triage Vital Signs ED Triage Vitals  Enc Vitals Group     BP 08/19/21 0919 105/67     Pulse Rate 08/19/21 0919 83     Resp 08/19/21 0919 18     Temp 08/19/21 0919 99.1 F (37.3 C)     Temp Source 08/19/21 0919 Oral     SpO2 08/19/21 0919 100 %     Weight --      Height --      Head Circumference --      Peak Flow --      Pain Score 08/19/21 0920 7     Pain Loc --      Pain Edu? --      Excl. in GC? --    No data found.  Updated Vital Signs BP 105/67 (BP Location: Right  Arm)   Pulse 83   Temp 99.1 F (37.3 C) (Oral)   Resp 18   LMP 07/30/2021   SpO2 100%   Visual Acuity Right Eye Distance:   Left Eye Distance:   Bilateral Distance:    Right Eye Near:   Left Eye Near:    Bilateral Near:     Physical Exam Constitutional:      General: She is in acute distress.  HENT:     Head: Normocephalic and atraumatic.     Right Ear: Tympanic membrane normal.     Left Ear: Tympanic membrane normal.     Nose: Nose normal.     Comments: Clear drainage    Mouth/Throat:     Pharynx: Oropharynx is clear.  Cardiovascular:     Rate and Rhythm: Normal rate.     Pulses: Normal pulses.  Pulmonary:     Effort: Pulmonary effort is normal.     Comments: coarse Musculoskeletal:     Cervical back: Normal range of motion.  Lymphadenopathy:     Cervical: No cervical adenopathy.  Neurological:  Mental Status: She is alert.      UC Treatments / Results  Labs (all labs ordered are listed, but only abnormal results are displayed) Labs Reviewed  CULTURE, GROUP A STREP [facility])  POCT RAPID STREP A, ED / UC    EKG   Radiology No results found.  Procedures Procedures (including critical care time)  Medications Ordered in UC Medications - No data to display  Initial Impression / Assessment and Plan / UC Course  I have reviewed the triage vital signs and the nursing notes.  Pertinent labs & imaging results that were available during my care of the patient were reviewed by me and considered in my medical decision making (see chart for details).     Sore throat URI  Final Clinical Impressions(s) / UC Diagnoses   Final diagnoses:  Sore throat  Chest congestion  Upper respiratory tract infection, unspecified type     Discharge Instructions      Ms. Scannell This is your after visit summary for today.   Sore throat strep test is negeative  Chest congestion (URI) Augmentin 875 mg BID x10 days Start Mucinex DM along with Augmentin Please  complete all of medication  Hydrate well with water and use Tylenol or Motrin for fever and pain relief  Please feel free to call our office, if you have any questions. Have a good day. Thad Ranger NP      ED Prescriptions     Medication Sig Dispense Auth. Provider   amoxicillin-clavulanate (AUGMENTIN) 875-125 MG tablet Take 1 tablet by mouth every 12 (twelve) hours. 14 tablet Barbette Merino, NP      PDMP not reviewed this encounter.   Thad Ranger [place], [place] 08/19/21 2159

## 2021-08-19 NOTE — Discharge Instructions (Addendum)
Ms. Cordone This is your after visit summary for today.   Sore throat strep test is negeative  Chest congestion (URI) Augmentin 875 mg BID x10 days Start Mucinex DM along with Augmentin Please complete all of medication  Hydrate well with water and use Tylenol or Motrin for fever and pain relief  Please feel free to call our office, if you have any questions. Have a good day. Thad Ranger NP

## 2021-08-21 LAB — CULTURE, GROUP A STREP (THRC)

## 2021-12-12 ENCOUNTER — Encounter (HOSPITAL_COMMUNITY): Payer: Self-pay

## 2021-12-12 ENCOUNTER — Ambulatory Visit (HOSPITAL_COMMUNITY)
Admission: EM | Admit: 2021-12-12 | Discharge: 2021-12-12 | Disposition: A | Discharge status: Home / Self Care | Payer: Commercial Managed Care - HMO | Attending: Internal Medicine | Admitting: Internal Medicine

## 2021-12-12 DIAGNOSIS — H6122 Impacted cerumen, left ear: Secondary | ICD-10-CM

## 2021-12-12 DIAGNOSIS — Z3201 Encounter for pregnancy test, result positive: Secondary | ICD-10-CM

## 2021-12-12 DIAGNOSIS — Z3491 Encounter for supervision of normal pregnancy, unspecified, first trimester: Secondary | ICD-10-CM

## 2021-12-12 LAB — POC URINE PREG, ED
Preg Test, Ur: NEGATIVE
Preg Test, Ur: POSITIVE — AB

## 2021-12-12 NOTE — ED Provider Notes (Signed)
[facility]    CSN: [id number] Arrival date & time: 12/12/21  1032      History   Chief Complaint Chief Complaint  Patient presents with   Otalgia    HPI Erika Shaw is a 26 y.o. female presents to urgent care today with complaint of left ear pain and scratchy throat.  She reports this started 1 week ago.  She denies difficulty swallowing.  She describes the ear pain as sharp and achy with associated ringing.  She denies headache, runny nose, nasal congestion, difficulty swallowing, cough, shortness of breath, nausea, vomiting or diarrhea.  She denies fever chills or body aches.  She has tried Hydrogen Peroxide OTC with some relief of symptoms.  She has not had sick contacts that she is aware of.  She also reports missed menses.  She has not had her menstrual cycle in 2 months.  She is sexually active and not on contraception.  She had a positive home pregnancy test.  She would like confirmation of this today.  HPI  History reviewed. No pertinent past medical history.  There are no problems to display for this patient.   History reviewed. No pertinent surgical history.  OB History   No obstetric history on file.      Home Medications    Prior to Admission medications   Medication Sig Start Date End Date Taking? Authorizing Provider  amoxicillin-clavulanate (AUGMENTIN) 875-125 MG tablet Take 1 tablet by mouth every 12 (twelve) hours. 08/19/21   Barbette Merino, NP    Family History History reviewed. No pertinent family history.  Social History Social History   Tobacco Use   Smoking status: Some Days    Types: Cigars   Smokeless tobacco: Never  Substance Use Topics   Alcohol use: Not Currently   Drug use: Not Currently     Allergies   Patient has no known allergies.   Review of Systems Review of Systems   History reviewed. No pertinent past medical history.  No current facility-administered medications for this encounter.   Current  Outpatient Medications  Medication Sig Dispense Refill   amoxicillin-clavulanate (AUGMENTIN) 875-125 MG tablet Take 1 tablet by mouth every 12 (twelve) hours. 14 tablet 0    No Known Allergies  History reviewed. No pertinent family history.  Social History   Socioeconomic History   Marital status: Single    Spouse name: Not on file   Number of children: Not on file   Years of education: Not on file   Highest education level: Not on file  Occupational History   Not on file  Tobacco Use   Smoking status: Some Days    Types: Cigars   Smokeless tobacco: Never  Substance and Sexual Activity   Alcohol use: Not Currently   Drug use: Not Currently   Sexual activity: Not on file  Other Topics Concern   Not on file  Social History Narrative   Not on file   Social Determinants of Health   Financial Resource Strain: Not on file  Food Insecurity: Not on file  Transportation Needs: Not on file  Physical Activity: Not on file  Stress: Not on file  Social Connections: Not on file  Intimate Partner Violence: Not on file     Constitutional: Denies fever, malaise, fatigue, headache or abrupt weight changes.  HEENT: Patient reports left ear pain and sore throat.  Denies eye pain, eye redness, wax buildup, runny nose, nasal congestion, bloody nose Respiratory: Denies difficulty breathing, shortness  of breath, cough or sputum production.   Cardiovascular: Denies chest pain, chest tightness, palpitations or swelling in the hands or feet.  Gastrointestinal: Denies abdominal pain, bloating, constipation, diarrhea or blood in the stool.  GU: Patient reports missed menses.  Denies urgency, frequency, pain with urination, burning sensation, blood in urine, odor or discharge. Neurological: Denies dizziness, difficulty with memory, difficulty with speech or problems with balance and coordination.   No other specific complaints in a complete review of systems (except as listed in HPI  above).   Physical Exam Triage Vital Signs ED Triage Vitals  Enc Vitals Group     BP 12/12/21 1054 (!) 110/53     Pulse Rate 12/12/21 1054 77     Resp 12/12/21 1054 16     Temp 12/12/21 1054 98.1 F (36.7 C)     Temp Source 12/12/21 1054 Oral     SpO2 12/12/21 1054 100 %     Weight --      Height --      Head Circumference --      Peak Flow --      Pain Score 12/12/21 1055 9     Pain Loc --      Pain Edu? --      Excl. in GC? --    No data found.  Updated Vital Signs BP (!) 110/53 (BP Location: Right Arm)   Pulse 77   Temp 98.1 F (36.7 C) (Oral)   Resp 16   LMP  (LMP Unknown)   SpO2 100%    Physical Exam  BP (!) 110/53 (BP Location: Right Arm)   Pulse 77   Temp 98.1 F (36.7 C) (Oral)   Resp 16   LMP  (LMP Unknown)   SpO2 100%  Wt Readings from Last 3 Encounters:  03/18/20 175 lb (79.4 kg)    General: Appears her stated age, in NAD. Skin: Warm, dry and intact.  HEENT: Head: normal shape and size; Left Ears: Cerumen impaction; Throat/Mouth: Teeth present, mucosa pink and moist, + PND, no exudate, lesions or ulcerations noted.  Neck: No neuropathy noted. Cardiovascular: Normal rate and rhythm. S1,S2 noted.  No murmur, rubs or gallops noted.  Pulmonary/Chest: Normal effort and positive vesicular breath sounds. No respiratory distress. No wheezes, rales or ronchi noted.  Neurological: Alert and oriented.   BMET    Component Value Date/Time   NA 138 03/17/2020 2102   K 3.6 03/17/2020 2102   CL 107 03/17/2020 2102   CO2 23 03/17/2020 2102   GLUCOSE 83 03/17/2020 2102   BUN 12 03/17/2020 2102   CREATININE 0.75 03/17/2020 2102   CALCIUM 8.8 (L) 03/17/2020 2102   GFRNONAA >60 03/17/2020 2102    Lipid Panel  No results found for: "CHOL", "TRIG", "HDL", "CHOLHDL", "VLDL", "LDLCALC"  CBC    Component Value Date/Time   WBC 4.0 03/17/2020 2102   RBC 4.61 03/17/2020 2102   HGB 14.1 03/17/2020 2102   HCT 43.1 03/17/2020 2102   PLT 207 03/17/2020 2102    MCV 93.5 03/17/2020 2102   MCH 30.6 03/17/2020 2102   MCHC 32.7 03/17/2020 2102   RDW 11.9 03/17/2020 2102    Hgb A1C No results found for: "HGBA1C"     UC Treatments / Results  Labs Labs Reviewed  POC URINE PREG, ED - Abnormal; Notable for the following components:      Result Value   Preg Test, Ur POSITIVE (*)    All other components within normal limits  POC URINE PREG, ED   Procedures Procedures (including critical care time)    Initial Impression / Assessment and Plan / UC Course  I have reviewed the triage vital signs and the nursing notes.  Pertinent labs & imaging results that were available during my care of the patient were reviewed by me and considered in my medical decision making (see chart for details).   Left Ear Pain, Sore Throat:  DDx include otitis media, allergic rhinitis, ETD, cerumen impaction Exam consistent with cerumen impaction left ear Manual lavage by CMA Encourage Debrox 2 times a week to prevent wax buildup  Missed Menses, Positive Home Pregnancy Test:  DDx include abnormal uterine bleeding Urine hCG positive We will have her follow-up with OB as an outpatient  Final Clinical Impressions(s) / UC Diagnoses   Final diagnoses:  Left ear impacted cerumen  Positive pregnancy test  First trimester pregnancy     Discharge Instructions      You were seen today for left ear pain.  Your left ear was full of wax.  This was cleaned out with water.  You can try Debrox solution 2 times weekly to prevent further wax buildup.  You did have a positive pregnancy test.  Please call [facility] to schedule an OB follow-up appointment     ED Prescriptions   None    PDMP not reviewed this encounter.   Jearld Fenton, NP 12/12/21 1127

## 2021-12-12 NOTE — ED Triage Notes (Signed)
Pt states left ear pain for the past week. Also states she thinks she missed her period and would like a pregnancy test.

## 2021-12-12 NOTE — Discharge Instructions (Signed)
You were seen today for left ear pain.  Your left ear was full of wax.  This was cleaned out with water.  You can try Debrox solution 2 times weekly to prevent further wax buildup.  You did have a positive pregnancy test.  Please call [facility] to schedule an OB follow-up appointment

## 2021-12-24 ENCOUNTER — Encounter (HOSPITAL_COMMUNITY): Payer: Self-pay | Admitting: *Deleted

## 2021-12-24 ENCOUNTER — Inpatient Hospital Stay (HOSPITAL_COMMUNITY)
Admission: AD | Admit: 2021-12-24 | Discharge: 2021-12-24 | Disposition: A | Discharge status: Home / Self Care | Payer: Commercial Managed Care - HMO | Attending: Obstetrics & Gynecology | Admitting: Obstetrics & Gynecology

## 2021-12-24 ENCOUNTER — Inpatient Hospital Stay (HOSPITAL_COMMUNITY): Payer: Commercial Managed Care - HMO

## 2021-12-24 DIAGNOSIS — Z3A08 8 weeks gestation of pregnancy: Secondary | ICD-10-CM | POA: Diagnosis not present

## 2021-12-24 DIAGNOSIS — F17290 Nicotine dependence, other tobacco product, uncomplicated: Secondary | ICD-10-CM | POA: Diagnosis not present

## 2021-12-24 DIAGNOSIS — A599 Trichomoniasis, unspecified: Secondary | ICD-10-CM | POA: Insufficient documentation

## 2021-12-24 DIAGNOSIS — O99331 Smoking (tobacco) complicating pregnancy, first trimester: Secondary | ICD-10-CM | POA: Insufficient documentation

## 2021-12-24 DIAGNOSIS — O3680X0 Pregnancy with inconclusive fetal viability, not applicable or unspecified: Secondary | ICD-10-CM | POA: Diagnosis not present

## 2021-12-24 DIAGNOSIS — O209 Hemorrhage in early pregnancy, unspecified: Secondary | ICD-10-CM | POA: Insufficient documentation

## 2021-12-24 LAB — URINALYSIS, ROUTINE W REFLEX MICROSCOPIC
Bilirubin Urine: NEGATIVE
Glucose, UA: NEGATIVE mg/dL
Ketones, ur: NEGATIVE mg/dL
Nitrite: NEGATIVE
Protein, ur: NEGATIVE mg/dL
Specific Gravity, Urine: 1.017 (ref 1.005–1.030)
pH: 6 (ref 5.0–8.0)

## 2021-12-24 LAB — CBC
HCT: 39.4 % (ref 36.0–46.0)
Hemoglobin: 13 g/dL (ref 12.0–15.0)
MCH: 30.9 pg (ref 26.0–34.0)
MCHC: 33 g/dL (ref 30.0–36.0)
MCV: 93.6 fL (ref 80.0–100.0)
Platelets: 252 10*3/uL (ref 150–400)
RBC: 4.21 MIL/uL (ref 3.87–5.11)
RDW: 12.6 % (ref 11.5–15.5)
WBC: 4.7 10*3/uL (ref 4.0–10.5)
nRBC: 0 % (ref 0.0–0.2)

## 2021-12-24 LAB — ABO/RH: ABO/RH(D): O POS

## 2021-12-24 LAB — HCG, QUANTITATIVE, PREGNANCY: hCG, Beta Chain, Quant, S: 8882 m[IU]/mL — ABNORMAL HIGH (ref <5)

## 2021-12-24 LAB — WET PREP, GENITAL
Clue Cells Wet Prep HPF POC: NONE SEEN
Sperm: NONE SEEN
WBC, Wet Prep HPF POC: >=10 — AB (ref <10)
Yeast Wet Prep HPF POC: NONE SEEN

## 2021-12-24 MED ORDER — METRONIDAZOLE 500 MG PO TABS
500.0000 mg | ORAL_TABLET | Freq: Once | ORAL | Status: AC
  Administered 2021-12-24: 500 mg via ORAL
  Filled 2021-12-24: qty 1

## 2021-12-24 MED ORDER — METRONIDAZOLE 500 MG PO TABS: 500.0000 mg | ORAL_TABLET | Freq: Two times a day (BID) | ORAL | 0 refills | Status: AC

## 2021-12-24 NOTE — MAU Provider Note (Signed)
History     CSN: [id number]  Arrival date and time: 12/24/21 9735   Event Date/Time   First Provider Initiated Contact with Patient 12/24/21 1001      Chief Complaint  Patient presents with   Vaginal Bleeding   HPI Erika Shaw is a 26 y.o. G2P0101 in early pregnancy with chief complaint of spotting after intercourse. This is a new problem, onset today. She is not filling a pad. She is not experiencing weakness, dizziness or syncope. Pain score 0/10. She denies dysuria, abdominal tenderness, fever or recent illness. Most recent sexual intercourse was last night.   OB History     Gravida  2   Para  1   Term  0   Preterm  1   AB      Living  1      SAB      IAB      Ectopic      Multiple      Live Births  1           History reviewed. No pertinent past medical history.  Past Surgical History:  Procedure Laterality Date   NO PAST SURGERIES      No family history on file.  Social History   Tobacco Use   Smoking status: Former    Types: Cigars   Smokeless tobacco: Never  Substance Use Topics   Alcohol use: Not Currently   Drug use: Not Currently    Allergies: No Known Allergies  Medications Prior to Admission  Medication Sig Dispense Refill Last Dose   amoxicillin-clavulanate (AUGMENTIN) 875-125 MG tablet Take 1 tablet by mouth every 12 (twelve) hours. (Patient not taking: Reported on 12/24/2021) 14 tablet 0 Not Taking    Review of Systems  Genitourinary:  Positive for vaginal bleeding.  All other systems reviewed and are negative.  Physical Exam   Blood pressure 114/73, pulse 78, temperature 98.3 F (36.8 C), resp. rate 18, height 5\' 5"  (1.651 m), weight 95.7 kg, last menstrual period 10/28/2021.  Physical Exam Vitals and nursing note reviewed. Exam conducted with a chaperone present.  Constitutional:      Appearance: Normal appearance. She is not ill-appearing.  Cardiovascular:     Rate and Rhythm: Normal rate and regular  rhythm.     Pulses: Normal pulses.     Heart sounds: Normal heart sounds.  Pulmonary:     Effort: Pulmonary effort is normal.     Breath sounds: Normal breath sounds.  Abdominal:     General: Abdomen is flat.     Tenderness: There is no abdominal tenderness.  Skin:    Capillary Refill: Capillary refill takes less than 2 seconds.  Neurological:     Mental Status: She is alert and oriented to person, place, and time.  Psychiatric:        Mood and Affect: Mood normal.        Behavior: Behavior normal.        Thought Content: Thought content normal.        Judgment: Judgment normal.     MAU Course  Procedures  MDM  --Reviewed Trichomonas diagnosis. Highly contagious and harmful to pregnancy. Likely source of patient's vaginal spotting. Advise treatment of all partners ASAP. Remain abstinent for 7-10 days after all partners have been treated. Condom use for remainder of pregnancy to reduce risk of reinfection.  Orders Placed This Encounter  Procedures   Wet prep, genital   10/30/2021 OB LESS THAN 14 WEEKS WITH  OB TRANSVAGINAL   US OB Transvaginal   Urinalysis, Routine w reflex microscopic Urine, Clean Catch   CBC   hCG, quantitative, pregnancy   Nursing communication   ABO/Rh   Discharge patient   Patient Vitals for the past 24 hrs:  BP Temp Pulse Resp Height Weight  12/24/21 0744 114/73 98.3 F (36.8 C) 78 18 5\' 5"  (1.651 m) 95.7 kg   Results for orders placed or performed during the hospital encounter of 12/24/21 (from the past 24 hour(s))  Urinalysis, Routine w reflex microscopic Urine, Clean Catch     Status: Abnormal   Collection Time: 12/24/21  8:05 AM  Result Value Ref Range   Color, Urine YELLOW YELLOW   APPearance CLEAR CLEAR   Specific Gravity, Urine 1.017 1.005 - 1.030   pH 6.0 5.0 - 8.0   Glucose, UA NEGATIVE NEGATIVE mg/dL   Hgb urine dipstick LARGE (A) NEGATIVE   Bilirubin Urine NEGATIVE NEGATIVE   Ketones, ur NEGATIVE NEGATIVE mg/dL   Protein, ur NEGATIVE  NEGATIVE mg/dL   Nitrite NEGATIVE NEGATIVE   Leukocytes,Ua SMALL (A) NEGATIVE   RBC / HPF 0-5 0 - 5 RBC/hpf   WBC, UA 6-10 0 - 5 WBC/hpf   Bacteria, UA RARE (A) NONE SEEN   Squamous Epithelial / LPF 0-5 0 - 5   Mucus PRESENT   CBC     Status: None   Collection Time: 12/24/21  8:16 AM  Result Value Ref Range   WBC 4.7 4.0 - 10.5 K/uL   RBC 4.21 3.87 - 5.11 MIL/uL   Hemoglobin 13.0 12.0 - 15.0 g/dL   HCT 12/26/21 70.6 - 23.7 %   MCV 93.6 80.0 - 100.0 fL   MCH 30.9 26.0 - 34.0 pg   MCHC 33.0 30.0 - 36.0 g/dL   RDW 62.8 31.5 - 17.6 %   Platelets 252 150 - 400 K/uL   nRBC 0.0 0.0 - 0.2 %  ABO/Rh     Status: None   Collection Time: 12/24/21  8:16 AM  Result Value Ref Range   ABO/RH(D) O POS    No rh immune globuloin      NOT A RH IMMUNE GLOBULIN CANDIDATE, PT RH POSITIVE Performed at [facility], [address], [place], [place]   hCG, quantitative, pregnancy     Status: Abnormal   Collection Time: 12/24/21  8:16 AM  Result Value Ref Range   hCG, Beta Chain, Quant, S 8,882 (H) <5 mIU/mL  Wet prep, genital     Status: Abnormal   Collection Time: 12/24/21  8:34 AM   Specimen: PATH Cytology Cervicovaginal Ancillary Only  Result Value Ref Range   Yeast Wet Prep HPF POC NONE SEEN NONE SEEN   Trich, Wet Prep PRESENT (A) NONE SEEN   Clue Cells Wet Prep HPF POC NONE SEEN NONE SEEN   WBC, Wet Prep HPF POC >=10 (A) <10   Sperm NONE SEEN    12/26/21 OB LESS THAN 14 WEEKS WITH OB TRANSVAGINAL  Result Date: 12/24/2021 CLINICAL DATA:  26 year old pregnant female presents with spotting. EDC by LMP: 08/04/2022, projecting to an expected gestational age of [redacted] weeks 1 day. Quantitative beta HCG pending. EXAM: OBSTETRIC <14 WK 08/06/2022 AND TRANSVAGINAL OB US TECHNIQUE: Both transabdominal and transvaginal ultrasound examinations were performed for complete evaluation of the gestation as well as the maternal uterus, adnexal regions, and pelvic cul-de-sac. Transvaginal technique was performed to  assess early pregnancy. COMPARISON:  None Available. FINDINGS: Intrauterine gestational  sac: Single intrauterine sac-like structure. Yolk sac:  Not Visualized. Embryo:  Not Visualized. Cardiac Activity: Not Visualized. MSD: 25.7 mm   7 w   4 d Subchorionic hemorrhage:  None visualized. Maternal uterus/adnexae: Anteverted uterus with no uterine fibroids. Left ovary measures 2.7 x 2.1 x 2.6 cm and is normal. Right ovary measures 2.7 x 1.7 x 2.7 cm (transabdominal measurements) and is normal. No abnormal ovarian or adnexal masses. Trace free fluid in the pelvic cul-de-sac. IMPRESSION: Pregnancy not definitively localized. A single intrauterine sac-like structure measures 7 weeks 4 days by mean sac diameter, without definitive features of pregnancy such as a yolk sac or embryo. No suspicious ovarian or adnexal masses. Recommend close clinical follow-up and serial serum beta HCG monitoring, with repeat obstetric scan in 10-14 days or earlier as warranted by beta HCG levels and clinical assessment. Electronically Signed   By: Ilona Sorrel M.D.   On: 12/24/2021 09:51    Meds ordered this encounter  Medications   metroNIDAZOLE (FLAGYL) tablet 500 mg    POSitive for Trichomonas. Will initiate treatment in MAU then prescribed remainder of prescription when she is discharged from MAU.   metroNIDAZOLE (FLAGYL) 500 MG tablet    Sig: Take 1 tablet (500 mg total) by mouth 2 (two) times daily for 13 doses.    Dispense:  13 tablet    Refill:  0    Patient given first pill at hospital, will start prescription tonight    Order Specific Question:   Supervising Provider    Answer:   Janyth Pupa [[id number]]   Assessment and Plan  --26 y.o. G2P0101, + gestational sac on formal ultrasound --Trichomonas, treatment initiated --Blood type O POS --Discharge home in stable condition with first trimester precautions  F/U: --Order placed for viability scan at [facility] in about two weeks  Darlina Rumpf, [place], MSN,  CNM 12/24/2021, 2:42 PM

## 2021-12-24 NOTE — Discharge Instructions (Signed)

## 2021-12-24 NOTE — MAU Note (Signed)
.  Erika Shaw is a 26 y.o. at Unknown here in MAU reporting: she had blood when she wiped this morning after going to the [place]. Had intercourse last night. No pain or cramping. LMP: 10/28/21(approx) Onset of complaint: this morning.  Pain score: 0 Vitals:   12/24/21 0744  BP: 114/73  Pulse: 78  Resp: 18  Temp: 98.3 F (36.8 C)     FHT:n/a Lab orders placed from triage:  u/a

## 2021-12-27 LAB — GC/CHLAMYDIA PROBE AMP (~~LOC~~) NOT AT ARMC
Chlamydia: NEGATIVE
Comment: NEGATIVE
Comment: NORMAL
Neisseria Gonorrhea: NEGATIVE

## 2022-01-02 IMAGING — DX DG CERVICAL SPINE COMPLETE 4+V
5 series · 5 of 5 positions shown · non-contrast
Comparison: None.

CLINICAL DATA: Neck pain and upper body pain for 1 month.

EXAM:
CERVICAL SPINE - COMPLETE 4+ VIEW

[c-spine lat]
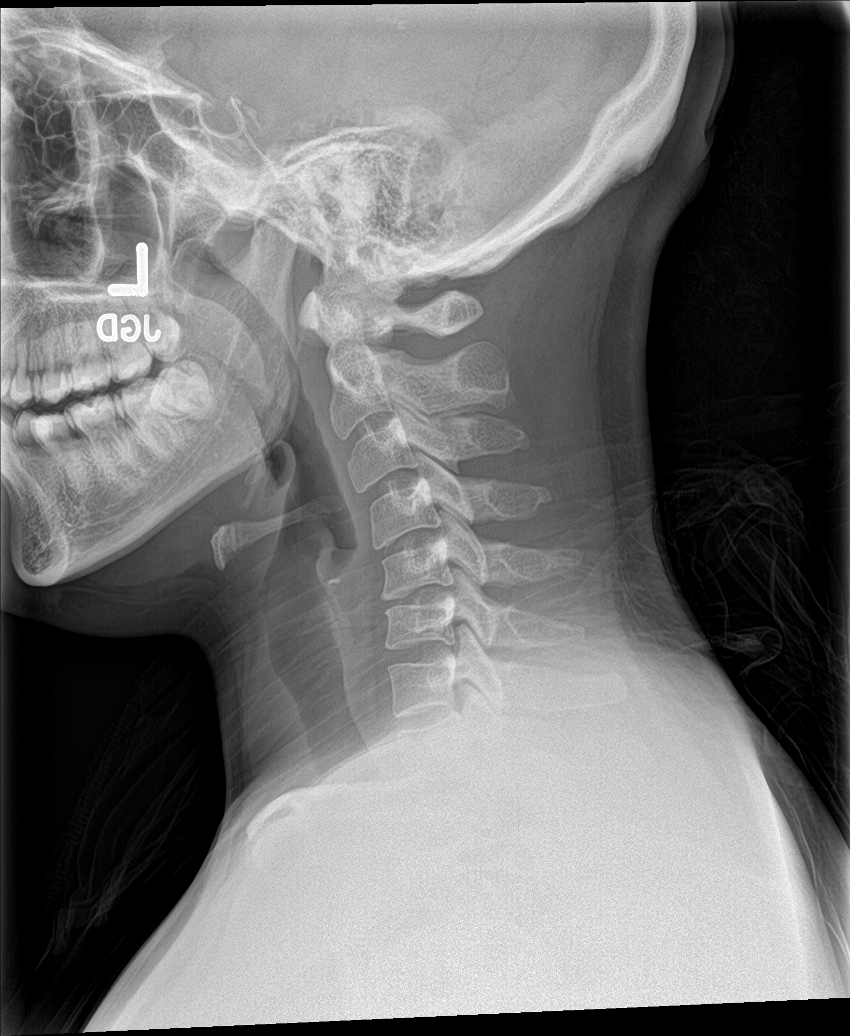

[c-spine obl (1 of 2)]
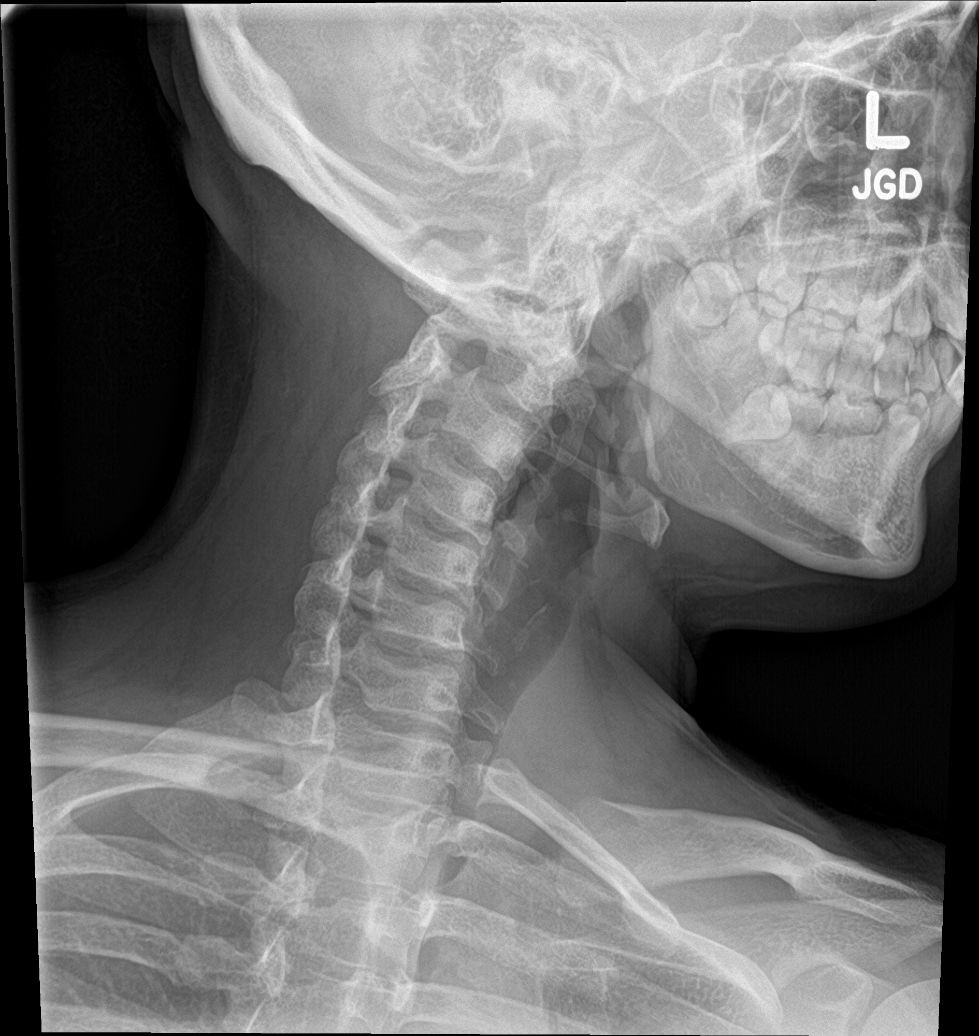

[c-spine obl (2 of 2)]
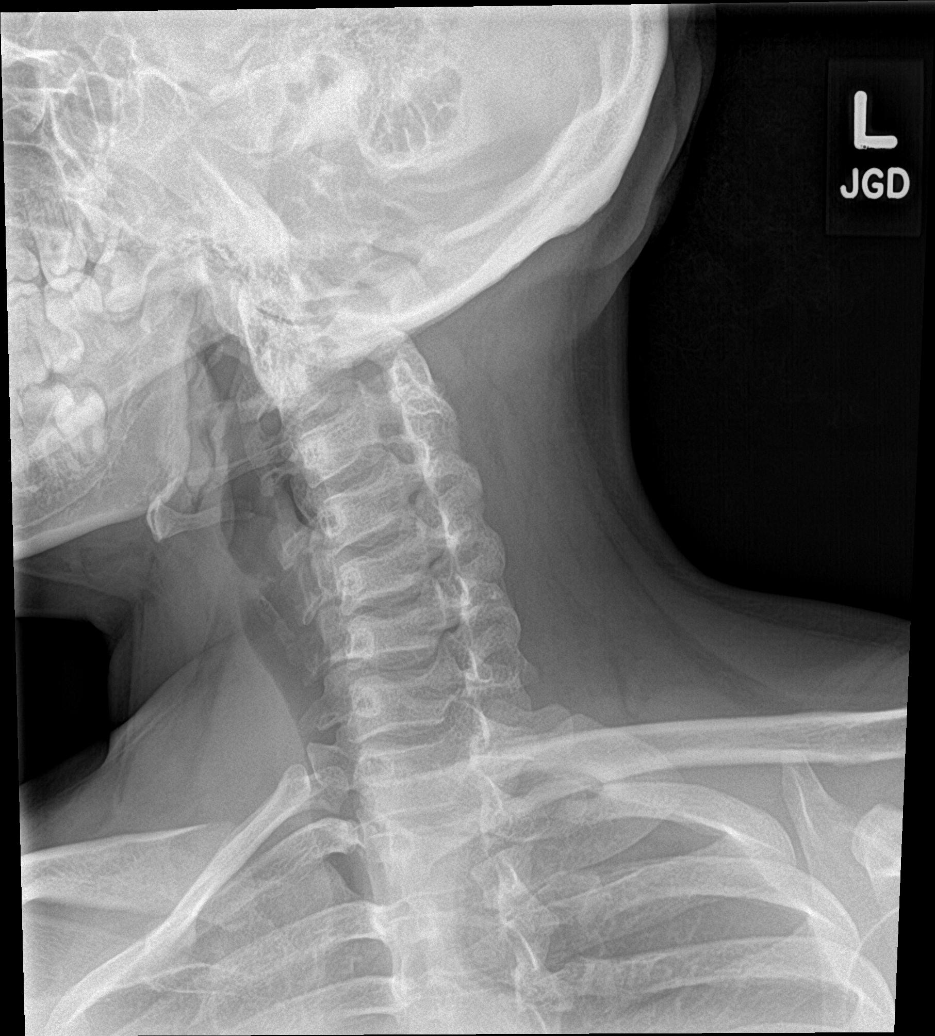

[c-spine ap]
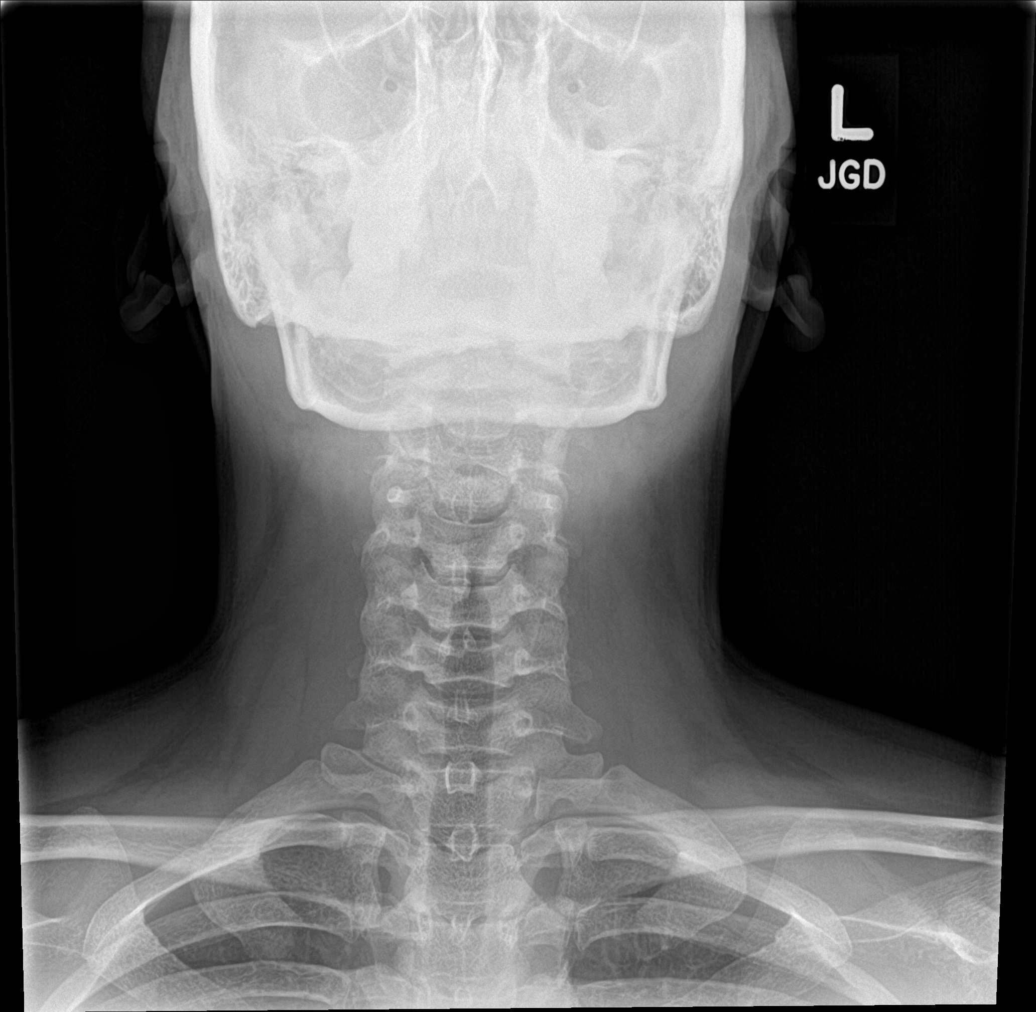

[c-spine swimmers]
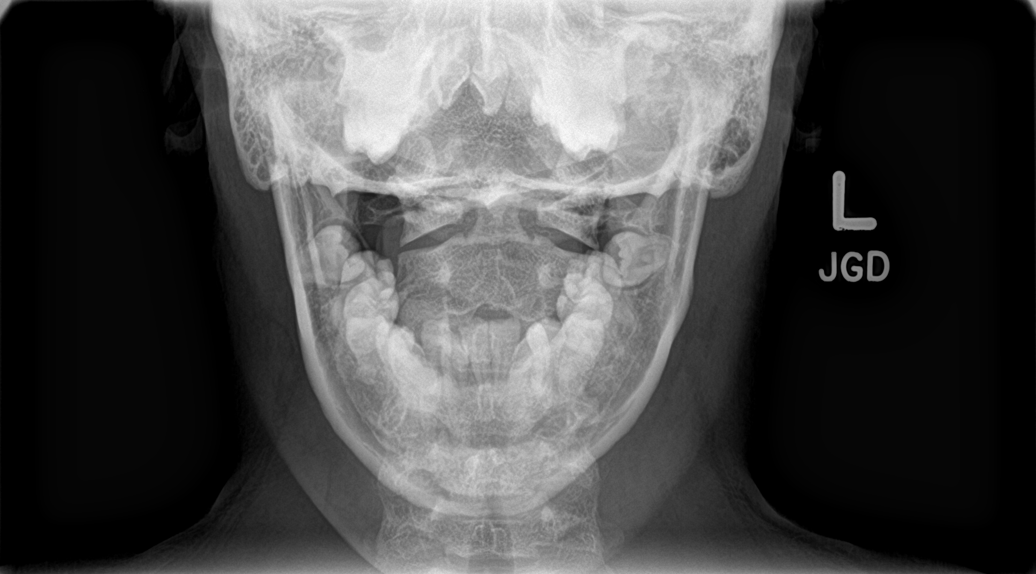

[5 of 5 positions shown; findings below may reference images not displayed]

FINDINGS: Neck positioned in kyphosis, apex at C5. No fracture or bone lesion.
No spondylolisthesis.

Disc spaces are well maintained.  Neural foramina are widely patent.

Soft tissues are unremarkable.
IMPRESSION: 1. Reversal the normal cervical lordosis. No fracture or acute
finding. No other abnormality.

## 2022-04-07 ENCOUNTER — Inpatient Hospital Stay (HOSPITAL_COMMUNITY)
Admission: AD | Admit: 2022-04-07 | Discharge: 2022-04-07 | Disposition: A | Discharge status: Home / Self Care | Payer: Medicaid Other | Attending: Obstetrics and Gynecology | Admitting: Obstetrics and Gynecology

## 2022-04-07 ENCOUNTER — Encounter (HOSPITAL_COMMUNITY): Payer: Self-pay | Admitting: *Deleted

## 2022-04-07 DIAGNOSIS — O26891 Other specified pregnancy related conditions, first trimester: Secondary | ICD-10-CM | POA: Insufficient documentation

## 2022-04-07 DIAGNOSIS — Z3A01 Less than 8 weeks gestation of pregnancy: Secondary | ICD-10-CM

## 2022-04-07 DIAGNOSIS — O210 Mild hyperemesis gravidarum: Secondary | ICD-10-CM | POA: Diagnosis present

## 2022-04-07 LAB — URINALYSIS, ROUTINE W REFLEX MICROSCOPIC
Bilirubin Urine: NEGATIVE
Glucose, UA: NEGATIVE mg/dL
Hgb urine dipstick: NEGATIVE
Ketones, ur: NEGATIVE mg/dL
Nitrite: NEGATIVE
Protein, ur: NEGATIVE mg/dL
Specific Gravity, Urine: 1.017 (ref 1.005–1.030)
pH: 7 (ref 5.0–8.0)

## 2022-04-07 LAB — POCT PREGNANCY, URINE: Preg Test, Ur: POSITIVE — AB

## 2022-04-07 MED ORDER — OMEPRAZOLE 20 MG PO CPDR: 20.0000 mg | DELAYED_RELEASE_CAPSULE | Freq: Every day | ORAL | 0 refills | Status: AC

## 2022-04-07 MED ORDER — PROMETHAZINE HCL 25 MG/ML IJ SOLN
25.0000 mg | Freq: Once | INTRAVENOUS | Status: AC
  Administered 2022-04-07: 25 mg via INTRAVENOUS
  Filled 2022-04-07: qty 1

## 2022-04-07 MED ORDER — FAMOTIDINE IN NACL 20-0.9 MG/50ML-% IV SOLN
20.0000 mg | Freq: Once | INTRAVENOUS | Status: AC
  Administered 2022-04-07: 20 mg via INTRAVENOUS
  Filled 2022-04-07: qty 50

## 2022-04-07 MED ORDER — LACTATED RINGERS IV SOLN
Freq: Once | INTRAVENOUS | Status: AC
  Filled 2022-04-07: qty 1000

## 2022-04-07 MED ORDER — PROMETHAZINE HCL 25 MG PO TABS: 25.0000 mg | ORAL_TABLET | Freq: Four times a day (QID) | ORAL | 0 refills | Status: AC | PRN

## 2022-04-07 NOTE — MAU Note (Signed)
Erika Shaw is a 27 y.o. at Unknown here in MAU reporting: can't keep anything down, nothing solid or liquids.  Making her weak.  Trying to force herself to eat, trying the nausea bands, it still throwing. Up No pain or bleeding LMP: after Christmas Onset of complaint: last wk Pain score: no Vitals:   04/07/22 1117  BP: 111/69  Pulse: 83  Resp: 17  Temp: 98.5 F (36.9 C)  SpO2: 100%      Lab orders placed from triage:  UPT/UA   Pt came by EMS, their report: "can't keep anything down".  +HPT x2 last Friday..VSS, blood sugar 117.  LMP 12/20.  Hx of SAB last Oct.

## 2022-04-07 NOTE — Discharge Instructions (Signed)
[place] for [employer] at [facility]             [address] [place], [place] [address] [place] for [facility] [place], [place], [place], [place], [address] [phone]  Center for [facility] [place], [place], [place], [place], [address] [phone]  Center for [facility] at [facility] [address], [place], [place], [place], [address] [phone]  Center for [facility] at [facility]                                 [place], [place], [place], [address] [phone]  Center for [facility] at [facility]                                    [address] [place], [place], [address] [phone]  Center for [place] at [facility] [address], [place], [place], [place], [address]                              [facility], [place], [place], [place], [address] [phone]  [facility]         Phone: [phone]  [facility]      Phone: [phone]   [facility]      Phone: [place] [facility]         Phone: [phone]   [place] [facility]    Phone: [place]      Phone: [phone]  [facility] [place]     Phone: [phone]  [facility]        Phone: [phone]

## 2022-04-07 NOTE — MAU Provider Note (Signed)
History     CSN: [id number]  Arrival date and time: 04/07/22 1057   Event Date/Time   First Provider Initiated Contact with Patient 04/07/22 1139      Chief Complaint  Patient presents with   Possible Pregnancy   Emesis   HPI Ms. Erika Shaw is a 27 y.o. year old [id number] female at [redacted]w[redacted]d weeks gestation who presents to MAU via EMS reporting she has been unable to keep anything down she found out she was pregnant (Friday 04/01/2022). She has tried wearing nausea bands with no success. She states she "feels very dehydrated and not steady on (her) feet and weak." She was scheduled to begin [facility] with [facility], but received an email today stating they were cancelling her appointment. She is unsure if they will allow her to start [facility] in their office and is requesting referral to another office.   OB History     Gravida  3   Para  1   Term  0   Preterm  1   AB  1   Living  1      SAB  1   IAB      Ectopic      Multiple      Live Births  1           History reviewed. No pertinent past medical history.  Past Surgical History:  Procedure Laterality Date   NO PAST SURGERIES      History reviewed. No pertinent family history.  Social History   Tobacco Use   Smoking status: Former    Types: Cigars   Smokeless tobacco: Never  Vaping Use   Vaping Use: Never used  Substance Use Topics   Alcohol use: Not Currently   Drug use: Not Currently    Allergies: No Known Allergies  No medications prior to admission.    Review of Systems  Constitutional:  Positive for appetite change.  HENT: Negative.    Eyes: Negative.   Respiratory: Negative.    Cardiovascular: Negative.   Gastrointestinal:  Positive for nausea and vomiting.  Endocrine: Negative.   Genitourinary: Negative.   Musculoskeletal: Negative.   Skin: Negative.   Allergic/Immunologic: Negative.   Neurological:  Positive for weakness.  Hematological: Negative.    Psychiatric/Behavioral: Negative.     Physical Exam   Blood pressure (!) 106/58, pulse 70, temperature 98.5 F (36.9 C), temperature source Oral, resp. rate 17, height 5\' 6"  (1.676 m), weight 91 kg, last menstrual period 03/02/2022, SpO2 100 %, unknown if currently breastfeeding.  Physical Exam Vitals and nursing note reviewed.  Constitutional:      Appearance: Normal appearance. She is normal weight.  Cardiovascular:     Rate and Rhythm: Normal rate.  Pulmonary:     Effort: Pulmonary effort is normal.  Abdominal:     General: Abdomen is flat.     Palpations: Abdomen is soft.  Genitourinary:    Comments: Not indicated Musculoskeletal:        General: Normal range of motion.  Skin:    General: Skin is warm and dry.  Neurological:     Mental Status: She is alert and oriented to person, place, and time.  Psychiatric:        Mood and Affect: Mood normal.        Behavior: Behavior normal.        Thought Content: Thought content normal.        Judgment: Judgment normal.    MAU  Course  Procedures  MDM CCUA IVFs: Phenergan 25 mg in LR 1000 ml @ 999 ml/hr -- N/V resolved Pepcid 20 mg IVPB MVI 1 amp in LR 1000 ml @ 999 ml/hr PO Challenge -- patient tolerated well   Assessment and Plan  1. Morning sickness - Information provided on morning sickness - Rx: Phenergan 25 mg po every 6 hours prn N/V - Rx: Prilosec 20 mg daily   2. [redacted] weeks gestation of pregnancy - Information provided on [place] [facility] provider list   - Discharge patient - Call to schedule [facility] - Patient verbalized an understanding of the plan of care and agrees.    Laury Deep, CNM 04/07/2022, 11:39 AM

## 2022-05-09 ENCOUNTER — Other Ambulatory Visit (HOSPITAL_COMMUNITY)
Admission: RE | Admit: 2022-05-09 | Discharge: 2022-05-09 | Disposition: A | Discharge status: Home / Self Care | Payer: Medicaid Other | Source: Ambulatory Visit | Attending: Obstetrics and Gynecology | Admitting: Obstetrics and Gynecology

## 2022-05-09 ENCOUNTER — Ambulatory Visit (INDEPENDENT_AMBULATORY_CARE_PROVIDER_SITE_OTHER): Payer: Medicaid Other

## 2022-05-09 ENCOUNTER — Ambulatory Visit: Payer: Medicaid Other | Admitting: *Deleted

## 2022-05-09 VITALS — BP 116/75 | HR 82 | Ht 65.0 in | Wt 211.8 lb

## 2022-05-09 DIAGNOSIS — Z3481 Encounter for supervision of other normal pregnancy, first trimester: Secondary | ICD-10-CM | POA: Insufficient documentation

## 2022-05-09 DIAGNOSIS — Z3A08 8 weeks gestation of pregnancy: Secondary | ICD-10-CM | POA: Diagnosis not present

## 2022-05-09 DIAGNOSIS — Z3480 Encounter for supervision of other normal pregnancy, unspecified trimester: Secondary | ICD-10-CM

## 2022-05-09 DIAGNOSIS — O3680X0 Pregnancy with inconclusive fetal viability, not applicable or unspecified: Secondary | ICD-10-CM

## 2022-05-09 MED ORDER — BLOOD PRESSURE KIT DEVI: 1.0000 | 0 refills | Status: AC

## 2022-05-09 MED ORDER — PRENATAL 28-0.8 MG PO TABS: 1.0000 | ORAL_TABLET | Freq: Every day | ORAL | 12 refills | Status: AC

## 2022-05-09 NOTE — Progress Notes (Signed)
New OB Intake  I connected with Hessie Diener  on 05/09/22 at  1:10 PM EST by In Person Visit and verified that I am speaking with the correct person using two identifiers. Nurse is located at [facility] and pt is located at [place].  I discussed the limitations, risks, security and privacy concerns of performing an evaluation and management service by telephone and the availability of in person appointments. I also discussed with the patient that there may be a patient responsible charge related to this service. The patient expressed understanding and agreed to proceed.  I explained I am completing New OB Intake today. We discussed EDD of TBD that is based on LMP of 03/02/22. [place] measurements today inconsistent with LMP. Pt scheduled for f/u US with MFM for dating and viability. Pt is G3/P1. I reviewed her allergies, medications, Medical/Surgical/OB history, and appropriate screenings. I informed her of [facility] services. [facility] information placed in AVS. Based on history, this is a high risk pregnancy.  Patient Active Problem List   Diagnosis Date Noted   Supervision of other normal pregnancy, antepartum 05/09/2022    Concerns addressed today  Delivery Plans Plans to deliver at [facility]. Patient given information for [facility] Healthy Baby website for more information about [facility] and [place]. Patient is not interested in water birth. Offered upcoming OB visit with CNM to discuss further.  MyChart/Babyscripts MyChart access verified. I explained pt will have some visits in office and some virtually. Babyscripts instructions given and order placed. Patient verifies receipt of registration text/e-mail. Account successfully created and app downloaded.  Blood Pressure Cuff/Weight Scale Blood pressure cuff ordered for patient to pick-up from [employer]. Explained after first prenatal appt pt will check weekly and document in 69. Patient does not have weight scale; patient may purchase  if they desire to track weight weekly in Babyscripts.  Anatomy US Explained first scheduled [place] will be around 19 weeks. Anatomy US scheduled for 19 wks at MFM. Pt notified to arrive at [facility].  Labs Discussed Johnsie Cancel genetic screening with patient. Would like both Panorama and Horizon drawn at new OB visit. Routine prenatal labs needed.  COVID Vaccine Patient has had COVID vaccine.   Social Determinants of Health Food Insecurity: Patient denies food insecurity. WIC Referral: Patient is interested in referral to [facility].  Transportation: Patient denies transportation needs. Childcare: Discussed no children allowed at ultrasound appointments. Offered childcare services; patient declines childcare services at this time.  Interested in [place]? If yes, send referral.   First visit review I reviewed new OB appt with patient. I explained they will have a provider visit that includes pap, labs and discussion with provider. Explained pt will be seen by Johnston Ebbs NP at first visit; encounter routed to appropriate provider. Explained that patient will be seen by pregnancy navigator following visit with provider.   Penny Pia, RN 05/09/2022  5:24 PM

## 2022-05-10 LAB — BETA HCG QUANT (REF LAB): hCG Quant: 1790 m[IU]/mL

## 2022-05-11 LAB — CERVICOVAGINAL ANCILLARY ONLY
Chlamydia: NEGATIVE
Comment: NEGATIVE
Comment: NORMAL
Neisseria Gonorrhea: NEGATIVE

## 2022-05-17 ENCOUNTER — Ambulatory Visit: Admission: RE | Admit: 2022-05-17 | Payer: Medicaid Other | Source: Ambulatory Visit

## 2022-05-26 ENCOUNTER — Ambulatory Visit (INDEPENDENT_AMBULATORY_CARE_PROVIDER_SITE_OTHER): Payer: Medicaid Other | Admitting: Student

## 2022-05-26 ENCOUNTER — Encounter: Payer: Self-pay | Admitting: Student

## 2022-05-26 VITALS — BP 110/74 | HR 70 | Ht 66.0 in | Wt 204.4 lb

## 2022-05-26 DIAGNOSIS — Z1339 Encounter for screening examination for other mental health and behavioral disorders: Secondary | ICD-10-CM

## 2022-05-26 DIAGNOSIS — Z3A01 Less than 8 weeks gestation of pregnancy: Secondary | ICD-10-CM | POA: Diagnosis not present

## 2022-05-26 DIAGNOSIS — O039 Complete or unspecified spontaneous abortion without complication: Secondary | ICD-10-CM

## 2022-05-26 DIAGNOSIS — Z5189 Encounter for other specified aftercare: Secondary | ICD-10-CM | POA: Diagnosis not present

## 2022-05-26 DIAGNOSIS — Z3202 Encounter for pregnancy test, result negative: Secondary | ICD-10-CM | POA: Diagnosis not present

## 2022-05-26 DIAGNOSIS — N96 Recurrent pregnancy loss: Secondary | ICD-10-CM | POA: Diagnosis not present

## 2022-05-26 LAB — POCT URINE PREGNANCY: Preg Test, Ur: NEGATIVE

## 2022-05-26 NOTE — Progress Notes (Signed)
Patient presents for sab follow up. Patient denies being evaluated since miscarriage. Patient states that she started spotting on 3/5 that continued 3/11 which is when she states that she believed she passed tissue. She  states that bleeding stopped 2-3 days ago. Denies having any pain or bleeding at this time. Patient was seen on 3/4 for intake. GS measuring [redacted]w[redacted]d with no FP seen. Patient was scheduled for a follow up scan, but did not show up.

## 2022-05-26 NOTE — Progress Notes (Signed)
History:  Ms. Erika Shaw is a 27 y.o. [id number] who presents to clinic today for follow up after perceived miscarriage. Patient had OB Intake visit on 03/04 where Gestational sac measured [redacted]w[redacted]d with no Fetal pulse observed. Beta quant was ~1,700. Patient was scheduled for a follow up scan, but did not show up. Patient states that she started spotting on 3/5 that continued 3/11 which is when she states that she believed she also passed tissue. She states that bleeding stopped 2-3 days ago. Denies having any pain or bleeding at this time. Patient disclosed feeling overwhelmed, stressed and depressed due to recent losses.   The following portions of the patient's history were reviewed and updated as appropriate: allergies, current medications, family history, past medical history, social history, past surgical history and problem list.  Review of Systems:  Review of Systems  Constitutional:  Negative for chills and fever.  Respiratory:  Negative for shortness of breath.   Cardiovascular:  Negative for chest pain.  Gastrointestinal:  Negative for abdominal pain, nausea and vomiting.  Genitourinary: Negative.   Neurological:  Negative for headaches.  Psychiatric/Behavioral:  Positive for depression. The patient is nervous/anxious.       Objective:  Physical Exam BP 110/74   Pulse 70   Ht 5\' 6"  (1.676 m)   Wt 204 lb 6.4 oz (92.7 kg)   LMP 03/02/2022   Breastfeeding Unknown   BMI 32.99 kg/m  Physical Exam Constitutional:      Appearance: Normal appearance. She is not ill-appearing.  Cardiovascular:     Rate and Rhythm: Normal rate.  Pulmonary:     Effort: Pulmonary effort is normal.  Abdominal:     Palpations: Abdomen is soft.  Genitourinary:    Comments: Not assessed Neurological:     General: No focal deficit present.     Mental Status: She is alert and oriented to person, place, and time. Mental status is at baseline.  Psychiatric:        Behavior: Behavior normal.         Thought Content: Thought content normal.        Judgment: Judgment normal.    Labs and Imaging Results for orders placed or performed in visit on 05/26/22 (from the past 24 hour(s))  POCT urine pregnancy     Status: Normal   Collection Time: 05/26/22 11:45 AM  Result Value Ref Range   Preg Test, Ur Negative Negative    No results found.  Health Maintenance Due  Topic Date Due   COVID-19 Vaccine (1) Never done   HIV Screening  Never done   Hepatitis C Screening  Never done   DTaP/Tdap/Td (1 - Tdap) Never done   PAP-Cervical Cytology Screening  Never done   PAP SMEAR-Modifier  Never done   INFLUENZA VACCINE  Never done    Labs, imaging and previous visits in Epic and Care Everywhere reviewed  Assessment & Plan:  1. Follow-up visit after miscarriage 2. SAB (spontaneous abortion) - POCT urine pregnancy - NEGATIVE - Beta hCG quant (ref lab) - Counseled on negative UPT aligning with miscarriage, but will need to confirm with quant to ensure safe management due to last visit > 2 weeks prior to today's appointment. Encouraged patient to focus on resting their body for approximately 3-6 months prior to attempting to conceive again. Will plan follow-up care and guidance based on lab result.  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 Discussed that it can be difficult to fully understand why this  happens. Offered patient further work-up when she is ready and/or if this were to occur again.  - Ambulatory referral to [facility]   Approximately 20 minutes of total time was spent with this patient on counseling and coordination of care  No follow-ups on file.  Johnston Ebbs, NP 05/26/2022 1:07 PM

## 2022-05-27 LAB — BETA HCG QUANT (REF LAB): hCG Quant: 3 m[IU]/mL

## 2022-09-12 ENCOUNTER — Other Ambulatory Visit: Payer: Self-pay

## 2022-09-12 ENCOUNTER — Emergency Department (HOSPITAL_COMMUNITY): Payer: Medicaid Other

## 2022-09-12 ENCOUNTER — Emergency Department (HOSPITAL_COMMUNITY)
Admission: EM | Admit: 2022-09-12 | Discharge: 2022-09-13 | Discharge status: Against Medical Advice | Payer: Medicaid Other | Attending: Emergency Medicine | Admitting: Emergency Medicine

## 2022-09-12 ENCOUNTER — Encounter (HOSPITAL_COMMUNITY): Payer: Self-pay

## 2022-09-12 DIAGNOSIS — Z1152 Encounter for screening for COVID-19: Secondary | ICD-10-CM | POA: Diagnosis not present

## 2022-09-12 DIAGNOSIS — R079 Chest pain, unspecified: Secondary | ICD-10-CM | POA: Diagnosis present

## 2022-09-12 DIAGNOSIS — Z5321 Procedure and treatment not carried out due to patient leaving prior to being seen by health care provider: Secondary | ICD-10-CM | POA: Diagnosis not present

## 2022-09-12 DIAGNOSIS — R0602 Shortness of breath: Secondary | ICD-10-CM | POA: Insufficient documentation

## 2022-09-12 LAB — RESP PANEL BY RT-PCR (RSV, FLU A&B, COVID)  RVPGX2
Influenza A by PCR: NEGATIVE
Influenza B by PCR: NEGATIVE
Resp Syncytial Virus by PCR: NEGATIVE
SARS Coronavirus 2 by RT PCR: NEGATIVE

## 2022-09-12 LAB — CBC
HCT: 41.5 % (ref 36.0–46.0)
Hemoglobin: 13.4 g/dL (ref 12.0–15.0)
MCH: 28.6 pg (ref 26.0–34.0)
MCHC: 32.3 g/dL (ref 30.0–36.0)
MCV: 88.7 fL (ref 80.0–100.0)
Platelets: 276 10*3/uL (ref 150–400)
RBC: 4.68 MIL/uL (ref 3.87–5.11)
RDW: 15 % (ref 11.5–15.5)
WBC: 5.1 10*3/uL (ref 4.0–10.5)
nRBC: 0 % (ref 0.0–0.2)

## 2022-09-12 LAB — BASIC METABOLIC PANEL
Anion gap: 7 (ref 5–15)
BUN: 7 mg/dL (ref 6–20)
CO2: 20 mmol/L — ABNORMAL LOW (ref 22–32)
Calcium: 9 mg/dL (ref 8.9–10.3)
Chloride: 110 mmol/L (ref 98–111)
Creatinine, Ser: 0.7 mg/dL (ref 0.44–1.00)
GFR, Estimated: >60 mL/min (ref >60)
Glucose, Bld: 81 mg/dL (ref 70–99)
Potassium: 3.6 mmol/L (ref 3.5–5.1)
Sodium: 137 mmol/L (ref 135–145)

## 2022-09-12 LAB — HCG, QUANTITATIVE, PREGNANCY: hCG, Beta Chain, Quant, S: <1 m[IU]/mL (ref <5)

## 2022-09-12 LAB — GROUP A STREP BY PCR: Group A Strep by PCR: DETECTED — AB

## 2022-09-12 LAB — TROPONIN I (HIGH SENSITIVITY): Troponin I (High Sensitivity): <2 ng/L (ref <18)

## 2022-09-12 MED ORDER — ACETAMINOPHEN 325 MG PO TABS
650.0000 mg | ORAL_TABLET | Freq: Once | ORAL | Status: AC | PRN
  Administered 2022-09-12: 650 mg via ORAL
  Filled 2022-09-12: qty 2

## 2022-09-12 NOTE — ED Notes (Signed)
Name called 3x no answer

## 2022-09-12 NOTE — ED Notes (Signed)
Patient also states some people at her job were sick. Reports some sore throat.

## 2022-09-12 NOTE — ED Notes (Signed)
Called name 3x no answer 

## 2022-09-12 NOTE — ED Triage Notes (Signed)
Patient arrives via EMS for chest pain dizziness and headache. States she has not been feeling goof for a few days now. Reports chills with no fever.

## 2022-09-12 NOTE — ED Provider Triage Note (Signed)
Emergency Medicine Provider Triage Evaluation Note  Nikola Renck , a 27 y.o. female  was evaluated in triage.  Pt complains of chest pain and SOB x1 day. Denies use of birth control or other hormone products.  Review of Systems  Positive: Chest pain, SOB Negative: N/V  Physical Exam  BP 113/77 (BP Location: Right Arm)   Pulse 60   Temp 98.4 F (36.9 C)   Resp 18   Ht 5\' 4"  (1.626 m)   Wt 63.5 kg   LMP 08/24/2022   SpO2 99%   BMI 24.03 kg/m  Gen:   Awake, no distress   Resp:  Normal effort  MSK:   Moves extremities without difficulty   Medical Decision Making  Medically screening exam initiated at 5:32 PM.  Appropriate orders placed.  Santoria Doran was informed that the remainder of the evaluation will be completed by another provider, this initial triage assessment does not replace that evaluation, and the importance of remaining in the ED until their evaluation is complete.    Pete Pelt, [place] 09/12/22 1733

## 2023-06-22 ENCOUNTER — Ambulatory Visit (HOSPITAL_COMMUNITY)
Admission: EM | Admit: 2023-06-22 | Discharge: 2023-06-22 | Disposition: A | Discharge status: Home / Self Care | Attending: Emergency Medicine | Admitting: Emergency Medicine

## 2023-06-22 ENCOUNTER — Other Ambulatory Visit: Payer: Self-pay

## 2023-06-22 ENCOUNTER — Encounter (HOSPITAL_COMMUNITY): Payer: Self-pay | Admitting: Emergency Medicine

## 2023-06-22 DIAGNOSIS — M62838 Other muscle spasm: Secondary | ICD-10-CM

## 2023-06-22 MED ORDER — DEXAMETHASONE SODIUM PHOSPHATE 10 MG/ML IJ SOLN
10.0000 mg | Freq: Once | INTRAMUSCULAR | Status: AC
  Administered 2023-06-22: 10 mg via INTRAMUSCULAR

## 2023-06-22 MED ORDER — NAPROXEN 500 MG PO TABS: 500.0000 mg | ORAL_TABLET | Freq: Two times a day (BID) | ORAL | 0 refills | Status: AC

## 2023-06-22 MED ORDER — DEXAMETHASONE SODIUM PHOSPHATE 10 MG/ML IJ SOLN
INTRAMUSCULAR | Status: AC
  Filled 2023-06-22: qty 1

## 2023-06-22 MED ORDER — BACLOFEN 20 MG PO TABS: 20.0000 mg | ORAL_TABLET | Freq: Three times a day (TID) | ORAL | 0 refills | Status: AC

## 2023-06-22 NOTE — ED Triage Notes (Signed)
 Patient reports left arm pain started 4 days ago.  History of the same pain and treated with muscle relaxer's.  No known injury.  Patient reports pain goes from left neck, shoulder, down left arm.  Patient is right handed.    Has had aleve

## 2023-06-22 NOTE — ED Provider Notes (Signed)
 [facility]    CSN: [id number] Arrival date & time: 06/22/23  1613      History   Chief Complaint Chief Complaint  Patient presents with   Arm Pain    HPI Erika Shaw is a 28 y.o. female.   Patient presents with left upper back and left-sided neck pain that radiates down her left arm x 4 days.  Patient reports history of same pain and was treated with muscle relaxers in the past with relief.  Patient denies any recent falls or known injury.  Denies any numbness, tingling, weakness. Patient reports taking Aleve with minimal relief.  The history is provided by the patient and medical records.  Arm Pain    History reviewed. No pertinent past medical history.  Patient Active Problem List   Diagnosis Date Noted   Supervision of other normal pregnancy, antepartum 05/09/2022    Past Surgical History:  Procedure Laterality Date   NO PAST SURGERIES      OB History     Gravida  3   Para  1   Term  0   Preterm  1   AB  2   Living  1      SAB  2   IAB  0   Ectopic  0   Multiple  0   Live Births  1            Home Medications    Prior to Admission medications   Medication Sig Start Date End Date Taking? Authorizing Provider  baclofen (LIORESAL) 20 MG tablet Take 1 tablet (20 mg total) by mouth 3 (three) times daily. 06/22/23  Yes Rosevelt Constable, Veera Stapleton A, NP  naproxen (NAPROSYN) 500 MG tablet Take 1 tablet (500 mg total) by mouth 2 (two) times daily. 06/22/23  Yes Levora Reas A, NP  Blood Pressure Monitoring (BLOOD PRESSURE KIT) DEVI 1 Device by Does not apply route once a week. Patient not taking: Reported on 05/26/2022 05/09/22   Constant, Peggy, MD  omeprazole (PRILOSEC) 20 MG capsule Take 1 capsule (20 mg total) by mouth daily. Patient not taking: Reported on 05/09/2022 04/07/22   Almond Army, CNM  Prenatal 28-0.8 MG TABS Take 1 tablet by mouth daily. Patient not taking: Reported on 05/26/2022 05/09/22   Constant, Peggy, MD  promethazine  (PHENERGAN) 25 MG tablet Take 1 tablet (25 mg total) by mouth every 6 (six) hours as needed for nausea or vomiting. If actively vomiting, please insert pills VAGINALLY. Patient not taking: Reported on 05/09/2022 04/07/22   Almond Army, CNM    Family History Family History  Problem Relation Age of Onset   Hypertension Father    Diabetes Father    Healthy Mother     Social History Social History   Tobacco Use   Smoking status: Former    Types: Cigars   Smokeless tobacco: Never  Vaping Use   Vaping status: Never Used  Substance Use Topics   Alcohol use: Not Currently   Drug use: Not Currently    Types: Marijuana     Allergies   Patient has no known allergies.   Review of Systems Review of Systems  Per HPI  Physical Exam Triage Vital Signs ED Triage Vitals  Encounter Vitals Group     BP 06/22/23 1721 120/83     Systolic BP Percentile --      Diastolic BP Percentile --      Pulse Rate 06/22/23 1721 73     Resp 06/22/23 1721  18     Temp 06/22/23 1721 98.5 F (36.9 C)     Temp Source 06/22/23 1721 Oral     SpO2 06/22/23 1721 98 %     Weight --      Height --      Head Circumference --      Peak Flow --      Pain Score 06/22/23 1718 10     Pain Loc --      Pain Education --      Exclude from Growth Chart --    No data found.  Updated Vital Signs BP 120/83 (BP Location: Right Arm) Comment (BP Location): large cuff  Pulse 73   Temp 98.5 F (36.9 C) (Oral)   Resp 18   LMP 05/29/2023 (Approximate)   SpO2 98%   Visual Acuity Right Eye Distance:   Left Eye Distance:   Bilateral Distance:    Right Eye Near:   Left Eye Near:    Bilateral Near:     Physical Exam Vitals and nursing note reviewed.  Constitutional:      General: She is awake. She is not in acute distress.    Appearance: Normal appearance. She is well-developed and well-groomed. She is not ill-appearing.  Musculoskeletal:     Left shoulder: Tenderness present. No swelling, deformity or  bony tenderness. Decreased range of motion.     Cervical back: Tenderness present. No swelling, deformity, rigidity, torticollis or bony tenderness. Pain with movement present. Normal range of motion.     Thoracic back: Tenderness present. No swelling, deformity or bony tenderness. Normal range of motion.     Lumbar back: Normal.     Comments: Tenderness to left mid trapezius and cervical paraspinous muscles  Skin:    General: Skin is warm and dry.  Neurological:     Mental Status: She is alert.  Psychiatric:        Behavior: Behavior is cooperative.      UC Treatments / Results  Labs (all labs ordered are listed, but only abnormal results are displayed) Labs Reviewed - No data to display  EKG   Radiology No results found.  Procedures Procedures (including critical care time)  Medications Ordered in UC Medications  dexamethasone (DECADRON) injection 10 mg (10 mg Intramuscular Given 06/22/23 1834)    Initial Impression / Assessment and Plan / UC Course  I have reviewed the triage vital signs and the nursing notes.  Pertinent labs & imaging results that were available during my care of the patient were reviewed by me and considered in my medical decision making (see chart for details).     Patient is well-appearing.  Vital stable. During assessment patient is cradling her left arm and winces in pain prior to and during palpation of the left upper back, left side of neck, and left shoulder.  Decreased range of motion to his shoulder due to pain.  Endorses increased pain with movement of neck.  Tenderness to left mid trapezius and cervical paraspinous muscles.  Given steroid injection in clinic.  Prescribed baclofen as needed for muscle pain and spasms.  Prescribe naproxen as needed for pain.  Discussed follow-up and return precautions. Final Clinical Impressions(s) / UC Diagnoses   Final diagnoses:  Cervical paraspinous muscle spasm     Discharge Instructions       You were given a steroid injection in clinic today to help with inflammation causing your pain. Take baclofen 3 times daily as needed for muscle pain and  spasms.  This can make you drowsy so do not drive, work, or drink alcohol while taking this. You can also take naproxen twice daily as needed for pain.  Do not take this other NSAIDs including ibuprofen, Motrin, Advil, Aleve. You can take 650 mg of Tylenol every 4-6 hours as needed for breakthrough pain. Apply heat to the area and perform gentle stretching to assist with pain. I have attached [place] [facility] that you can follow-up with if your pain persists or reoccurs. Return here as needed.    ED Prescriptions     Medication Sig Dispense Auth. Provider   baclofen (LIORESAL) 20 MG tablet Take 1 tablet (20 mg total) by mouth 3 (three) times daily. 30 each Levora Reas A, NP   naproxen (NAPROSYN) 500 MG tablet Take 1 tablet (500 mg total) by mouth 2 (two) times daily. 30 tablet Levora Reas A, NP      PDMP not reviewed this encounter.   Levora Reas A, NP 06/22/23 [phone]

## 2023-06-22 NOTE — Discharge Instructions (Addendum)
 You were given a steroid injection in clinic today to help with inflammation causing your pain. Take baclofen 3 times daily as needed for muscle pain and spasms.  This can make you drowsy so do not drive, work, or drink alcohol while taking this. You can also take naproxen twice daily as needed for pain.  Do not take this other NSAIDs including ibuprofen, Motrin, Advil, Aleve. You can take 650 mg of Tylenol every 4-6 hours as needed for breakthrough pain. Apply heat to the area and perform gentle stretching to assist with pain. I have attached [place] [facility] that you can follow-up with if your pain persists or reoccurs. Return here as needed.
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
